# Patient Record
Sex: Female | Born: 1955 | Race: White | Hispanic: No | Marital: Married | Smoking: Never smoker
Health system: Southern US, Community
[De-identification: ages and names within clinical notes are randomized; demographics above are authoritative.]

## PROBLEM LIST (undated history)

## (undated) DIAGNOSIS — R569 Unspecified convulsions: Secondary | ICD-10-CM

## (undated) HISTORY — PX: TONSILLECTOMY: SHX5217

## (undated) HISTORY — DX: Unspecified convulsions: R56.9

## (undated) HISTORY — PX: COLONOSCOPY: SHX174

---

## 1984-09-08 HISTORY — PX: OOPHORECTOMY: SHX86

## 1984-09-08 HISTORY — PX: DIAGNOSTIC LAPAROSCOPY: SUR761

## 2005-09-08 DIAGNOSIS — R569 Unspecified convulsions: Secondary | ICD-10-CM

## 2005-09-08 HISTORY — DX: Unspecified convulsions: R56.9

## 2006-02-13 ENCOUNTER — Emergency Department (HOSPITAL_COMMUNITY): Admission: EM | Admit: 2006-02-13 | Discharge: 2006-02-13 | Payer: Self-pay | Admitting: Emergency Medicine

## 2008-06-15 ENCOUNTER — Other Ambulatory Visit: Admission: RE | Admit: 2008-06-15 | Discharge: 2008-06-15 | Payer: Self-pay | Admitting: Family Medicine

## 2008-10-16 ENCOUNTER — Other Ambulatory Visit: Admission: RE | Admit: 2008-10-16 | Discharge: 2008-10-16 | Payer: Self-pay | Admitting: Family Medicine

## 2012-12-10 ENCOUNTER — Telehealth: Payer: Self-pay | Admitting: *Deleted

## 2012-12-10 NOTE — Telephone Encounter (Signed)
See paper chart prior to EPIC.  Patient was scheduled for Urodynamic testing to assist in planning surgery.  After checking surgical benefits, patient states her OOP cost prevent her from considering surgery at this time.  Per Dr Edward Jolly, Urodynamics not indicated if not planning surgery so patient agrees to cancel appointment.  Would like to know what other options (besides surgery) she has and states she's "got to do something", please advise .Paper chart to office.

## 2012-12-12 NOTE — Telephone Encounter (Signed)
Schedule pt for ov to discuss her options

## 2012-12-13 NOTE — Telephone Encounter (Signed)
Regarding phone note (route to Ms. [name])

## 2012-12-14 NOTE — Telephone Encounter (Signed)
Left message on home CB# to return call to make OV appt. With Dr. Farrel Gobble. sue

## 2012-12-15 NOTE — Telephone Encounter (Signed)
Left message at CB# to return our call to set up appt. . sue

## 2012-12-16 ENCOUNTER — Ambulatory Visit: Payer: Self-pay | Admitting: Obstetrics and Gynecology

## 2012-12-20 ENCOUNTER — Institutional Professional Consult (permissible substitution): Payer: Self-pay | Admitting: Obstetrics and Gynecology

## 2012-12-20 ENCOUNTER — Telehealth: Payer: Self-pay | Admitting: Gynecology

## 2012-12-20 NOTE — Telephone Encounter (Signed)
Pt is calling to find out why the Urodynamics is no longer necessary. She wants to know how to proceed from here. She is unable to pay the amount necessary for the urodynamics visit. She is extremely disappointed with how we are handling her treatment. Please call the patient.   Dr. Edward Jolly,   I am not sure if you are able to discuss the different options with this patient or not. She has bladder prolapse and was supposed to also have an ovary removed.    Thanks,  [employer]

## 2012-12-20 NOTE — Telephone Encounter (Signed)
Tina Peck,  [name] is facilitating communication to set up an appointment for the patient.  Thank you.

## 2012-12-20 NOTE — Telephone Encounter (Signed)
I had asked that the patient come in to discuss her options, not sure what happened after that, I'd like to see her so we can figure out what works for her, I understand she has a high deductable but we need to come up with a plan, not sure if the correspondance got lost with our transistion to [employer]

## 2012-12-27 ENCOUNTER — Encounter: Payer: Self-pay | Admitting: *Deleted

## 2012-12-29 ENCOUNTER — Ambulatory Visit (INDEPENDENT_AMBULATORY_CARE_PROVIDER_SITE_OTHER): Payer: BC Managed Care – PPO | Admitting: Gynecology

## 2012-12-29 ENCOUNTER — Encounter: Payer: Self-pay | Admitting: Gynecology

## 2012-12-29 VITALS — BP 118/78 | HR 72 | Resp 16

## 2012-12-29 DIAGNOSIS — N8111 Cystocele, midline: Secondary | ICD-10-CM

## 2012-12-29 DIAGNOSIS — N814 Uterovaginal prolapse, unspecified: Secondary | ICD-10-CM

## 2012-12-29 DIAGNOSIS — R569 Unspecified convulsions: Secondary | ICD-10-CM | POA: Insufficient documentation

## 2012-12-29 DIAGNOSIS — IMO0002 Reserved for concepts with insufficient information to code with codable children: Secondary | ICD-10-CM

## 2012-12-29 NOTE — Progress Notes (Signed)
57 y.o. marriedWhite female G6P6 here to discuss alternatives to surgery for her uterine and bladder prolapse.  Pt in high deductable plan and at present cannot afford her deductable.  Pt reports prolapse is significant that she and her husband are unable to attain vaginal prolapse.   We had a long discussion regarding pessary placement which should buy her time and offer her comfort from her prolapse.  We discussed the different types of passaries available, questions were addressed and she is agreeable to try this route for now but ultimately would like to have surgical repair.  ROS: no discharge or pelvic pain, no dysuria, trouble voiding or hematuria. Denies incontinence, need for perineal splinting with bowel movements  Exam:   BP 118/78  Pulse 72  Resp 16 General appearance: alert, cooperative and appears stated age Cervical, supraclavicular, and axillary nodes normal. and jj   Pelvic: External genitalia:  no lesions              Urethra: normal appearing urethra with no masses, tenderness or lesions              Bartholins and Skenes: normal                 Vagina: PELVIC FLOOR EXAM: cystocele grade 3, uterine descensus grade to  hymen with minimal valsalva-supine              Cervix: normal appearance Bimanual Exam:  Uterus:  uterus is normal size, shape, consistency and nontender,  hypermobile               Adnexa:    no masses               Anus:  defer exam  The vaginal length was measured from apex to pubic symphysis, approx 8cm, a #5 ring with support was placed.  Pt denies any discomfort sitting or standing, ring shifted with valsalva in the supine position. Pt was able to remove and place with ease  A: Cystocele- symptomatic      Uterine prolapse  P: Pt will return to office to try alternative pessaries, she was instructed to be available to return the same day to confirm ease of voiding and good placement, she is agreeable   An After Visit Summary was printed and given to  the patient.

## 2013-01-06 ENCOUNTER — Telehealth: Payer: Self-pay | Admitting: *Deleted

## 2013-01-06 NOTE — Telephone Encounter (Signed)
Pessary has arrived, call to patient to schedule OV, LMTCB.

## 2013-01-10 ENCOUNTER — Ambulatory Visit (INDEPENDENT_AMBULATORY_CARE_PROVIDER_SITE_OTHER): Payer: BC Managed Care – PPO | Admitting: Nurse Practitioner

## 2013-01-10 ENCOUNTER — Encounter: Payer: Self-pay | Admitting: Nurse Practitioner

## 2013-01-10 ENCOUNTER — Other Ambulatory Visit: Payer: Self-pay | Admitting: Nurse Practitioner

## 2013-01-10 VITALS — BP 115/68 | HR 60 | Ht 69.75 in | Wt 158.0 lb

## 2013-01-10 DIAGNOSIS — G40309 Generalized idiopathic epilepsy and epileptic syndromes, not intractable, without status epilepticus: Secondary | ICD-10-CM

## 2013-01-10 MED ORDER — CARBAMAZEPINE 200 MG PO TABS: 200.0000 mg | ORAL_TABLET | Freq: Three times a day (TID) | ORAL | Status: DC

## 2013-01-10 NOTE — Patient Instructions (Addendum)
CBC, CMP, carbamazepine level today  Will renew medications Followup yearly

## 2013-01-10 NOTE — Progress Notes (Signed)
HPI: Patient returns for followup after last visit 09/22/2011. She is a previous patient of Dr. 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 She is taking and tolerating Epitol. She denies any dj vu, strange odors or taste. She has not had further seizure activity. She is tolerating her medication without side effects. EEG in the past was abnormal showing left frontotemporal epileptiform  activity.  ROS:   Negative Physical Exam General: well developed, well nourished, seated, in no evident distress Head: head normocephalic and atraumatic. Oropharynx benign Neck: supple with no carotid or supraclavicular bruits Cardiovascular: regular rate and rhythm, no murmurs  Neurologic Exam Mental Status: Awake and fully alert. Follows all commands.  Mood and affect appropriate.  Cranial Nerves: Fundoscopic exam reveals sharp disc margins. Pupils equal, briskly reactive to light. Extraocular movements full without nystagmus. Visual fields full to confrontation. Hearing intact and symmetric to finger snap. Facial sensation intact. Face, tongue, palate move normally and symmetrically. Neck flexion and extension normal.  Motor: Normal bulk and tone. Normal strength in all tested extremity muscles. Sensory.: intact to touch and pinprick and vibratory.  Coordination: Rapid alternating movements normal in all extremities. Finger-to-nose and heel-to-shin performed accurately bilaterally. Gait and Station: Arises from chair without difficulty. Stance is normal. Gait demonstrates normal stride length and balance . Able to heel, toe and tandem walk without difficulty.  Reflexes:2+ and symmetric. Toes downgoing.     ASSESSMENT: History of single nocturnal generalized seizure occurring in June 2007 EEG was abnormal showing left frontotemporal epileptiform activity.   PLAN:CBC, CMP, carbamazepine level today  Will renew medications Followup yearly. Patient will  be assigned to Dr. Kingsley Spittle Darrol Angel, GNP-BC APRN

## 2013-01-11 ENCOUNTER — Other Ambulatory Visit: Payer: Self-pay | Admitting: *Deleted

## 2013-01-11 DIAGNOSIS — N8110 Cystocele, unspecified: Secondary | ICD-10-CM

## 2013-01-11 LAB — COMPREHENSIVE METABOLIC PANEL
AST: 14 IU/L (ref 0–40)
Albumin/Globulin Ratio: 1.9 (ref 1.1–2.5)
Albumin: 4.2 g/dL (ref 3.5–5.5)
Alkaline Phosphatase: 133 IU/L — ABNORMAL HIGH (ref 39–117)
CO2: 30 mmol/L — ABNORMAL HIGH (ref 19–28)
GFR calc non Af Amer: 101 mL/min/{1.73_m2} (ref >59)
Globulin, Total: 2.2 g/dL (ref 1.5–4.5)
Potassium: 4.3 mmol/L (ref 3.5–5.2)
Sodium: 142 mmol/L (ref 134–144)
Total Protein: 6.4 g/dL (ref 6.0–8.5)

## 2013-01-11 LAB — CARBAMAZEPINE LEVEL, TOTAL: Carbamazepine Lvl: 5.6 ug/mL (ref 4.0–12.0)

## 2013-01-11 LAB — CBC WITH DIFFERENTIAL
Basophils Absolute: 0 10*3/uL (ref 0.0–0.2)
Basos: 1 % (ref 0–3)
Hemoglobin: 14.3 g/dL (ref 11.1–15.9)
MCHC: 34.9 g/dL (ref 31.5–35.7)
MCV: 88 fL (ref 79–97)
Monocytes: 8 % (ref 4–12)
Platelets: 323 10*3/uL (ref 155–379)

## 2013-01-13 NOTE — Telephone Encounter (Signed)
Spoke with patient to let her know that pessary has arrived and we can schedule appointment for insertion. Per patient request, appointment 01-24-13, declined earlier appointment.

## 2013-01-24 ENCOUNTER — Encounter: Payer: Self-pay | Admitting: Gynecology

## 2013-01-24 ENCOUNTER — Ambulatory Visit (INDEPENDENT_AMBULATORY_CARE_PROVIDER_SITE_OTHER): Payer: BC Managed Care – PPO | Admitting: Gynecology

## 2013-01-24 VITALS — BP 120/60

## 2013-01-24 DIAGNOSIS — N8111 Cystocele, midline: Secondary | ICD-10-CM

## 2013-01-24 DIAGNOSIS — N814 Uterovaginal prolapse, unspecified: Secondary | ICD-10-CM

## 2013-01-24 DIAGNOSIS — N8110 Cystocele, unspecified: Secondary | ICD-10-CM

## 2013-01-24 NOTE — Progress Notes (Signed)
57 y.o. married White female   G6P6 here for pessary fitting.  Patient has been diagnosed with the following indications warranting pessary use: Cystocele- symptomatic with uterine prolapse..    She reports these associated symptoms: Vaginal discharge: no Vaginal pressure:  yes Urinary symptoms:   none.   Other:  Pressure and bulge with voiding but no incontinence  She has been counseled about other options including pelvic physical therapy, surgical intervention, as well as doing nothing.  She has decided to proceed with pessary use and is here for fitting today.    Patient is sexually active. Urine culture has not been performed.  Exam: BP 120/60 General appearance: alert, cooperative and appears stated age Inguinal adenopathy: none   Pelvic: External genitalia:  no lesions              Urethra: normal appearing urethra with no masses, tenderness or lesions              Bartholins and Skenes: normal                 Vagina: vaginal tenderness none              Cervix: mobile, nontneder Bimanual Exam:  Uterus:  uterus is normal size, shape, consistency and nontender                               Adnexa:    not indicated                               Anus:  defer exam  Procedure:  Patient fitted with the following pessary and sizes:  Hodge with support #4.  After fitting, patient was advised to redress, ambulate, and attempt to void.  She is encouraged to do usually activities and to return to office in approx 3h to assess  Assessment:   Cystocele with uterine prolapse, for conservative management   Plan: Pt will return to office as assess afteer several hours, if good fit, we will teach pt to place and remove pessary.  She was advised to remove pessary with coitus   Aftercare summary was provided.    Pt returned back to office after wearing pessary for 3h, she states that she can feel it and wants it out.  Pt states that pessary came a little out with void but went back  in. We placed a ring with support #5 which was similarly comfortable in the office but concerns regarding the cystocele support we felt she should have a longer trail than she could this afternoon, pt agrees to return at a later date, she would like to try the hodge with support #3 We will call her when it is in

## 2013-09-21 ENCOUNTER — Telehealth: Payer: Self-pay | Admitting: *Deleted

## 2013-09-21 NOTE — Telephone Encounter (Signed)
Previously ordered a number 4 Hodge that patient decided she did not like.  I did not order another for her.  What do I need to order?

## 2013-09-21 NOTE — Telephone Encounter (Signed)
Message copied by Jaymes Graff on Wed Sep 21, 2013  1:11 PM ------      Message from: Suzy Bouchard      Created: Tue Sep 20, 2013  3:21 PM      Regarding: Pessary Order       Gay Filler,                  Tina Peck was here back in April and May. She said that another pessary was supposed to be ordered for her and she is waiting to hear from [place]. Is that what your notes show?  ------

## 2013-09-23 NOTE — Telephone Encounter (Signed)
5 ring with support in chartr

## 2013-09-23 NOTE — Telephone Encounter (Signed)
Call to patient. Advised that pessary was not ordere at last appointment. Apologized for delay. Patient states she is ok, she didn't follow-up either. Advised pessary ordered and may be here as soon as Monday.  Patient states she has AEX scheduled in March and will just wait until then because she does not want to pay another $30 co-pay. Advised it will be here if she decides she wants to come in for it or will hold till AEX 11-2013.  Routing to provider for final review. Patient agreeable to disposition. Will close encounter

## 2013-10-24 ENCOUNTER — Telehealth: Payer: Self-pay | Admitting: Nurse Practitioner

## 2013-10-24 NOTE — Telephone Encounter (Signed)
I called the pharmacy.  The pharmacist said she used to get this med through a discount program, but that drug is no longer part of that program and her co-pay would be $49.58.  Iwent online to goodrx.com and see the patient can get this drug at [facility] for $9.99 or [facility] for $10.17.  I called the patient back.  Relayed this info to her.  She will go online to goodrx.com and decide which pharmacy is best for her.  Whichever pharmacy she cooses can call [facility] and transfer the Rx via phone.  Patient was very grateful and will call us back if needed.

## 2013-10-24 NOTE — Telephone Encounter (Signed)
Pt calling very upset that her anti seizure medication has increased from $6 to $50 a month and she needs an alternative please call as soon as possible.

## 2013-11-14 ENCOUNTER — Ambulatory Visit: Payer: Self-pay | Admitting: Gynecology

## 2013-11-21 MED ORDER — ALUM-MAG HYDROXIDE-SIMETH 200 MG-200 MG-20 MG/5 ML ORAL SUSP
200-200-20 mg/5 mL | ORAL | Status: AC
Start: 2013-11-21 — End: 2013-11-21
  Administered 2013-11-21: 19:00:00 via ORAL

## 2013-11-21 MED ORDER — OMEPRAZOLE 20 MG TAB, DELAYED RELEASE
20 mg | ORAL_TABLET | Freq: Two times a day (BID) | ORAL | Status: AC
Start: 2013-11-21 — End: 2013-12-01

## 2013-11-21 MED ORDER — SUCRALFATE 100 MG/ML ORAL SUSP
100 mg/mL | Freq: Four times a day (QID) | ORAL | Status: AC
Start: 2013-11-21 — End: 2013-12-01

## 2013-11-21 MED FILL — MAG-AL PLUS 200 MG-200 MG-20 MG/5 ML ORAL SUSPENSION: 200-200-20 mg/5 mL | ORAL | Qty: 30

## 2013-11-21 NOTE — ED Notes (Signed)
Pt was able to drink gingerale   No vomiting, n o drooling  No diff breathing or speaking

## 2013-11-21 NOTE — ED Notes (Signed)
Pt sts she feels food is stuck   Was eating a club sandwich   Pt sts this has happened many times   sts didn't try to drink anything yet

## 2013-11-21 NOTE — ED Provider Notes (Signed)
HPI Comments: This pt was eating lunch a club sandwich and then felt a piece of sandwich get stuck in her throat/chest and was worried she could not breathe. Pt denies not removing the toothpick and denies any etoh and states she has had this in the past. Pt denies fevers or prior gi issues.        Patient is a 58 y.o. female presenting with abdominal pain. The history is provided by the patient, the spouse and a friend.   Abdominal Pain   This is a new problem. The pain is located in the epigastric region and chest. The quality of the pain is sharp. Associated symptoms include nausea. Pertinent negatives include no fever, no belching, no diarrhea, no vomiting, no dysuria, no frequency, no headaches, no myalgias, no chest pain and no back pain.        History reviewed. No pertinent past medical history.     Past Surgical History   Procedure Laterality Date   ??? Hx gi           History reviewed. No pertinent family history.     History     Social History   ??? Marital Status: MARRIED     Spouse Name: N/A     Number of Children: N/A   ??? Years of Education: N/A     Occupational History   ??? Not on file.     Social History Main Topics   ??? Smoking status: Never Smoker    ??? Smokeless tobacco: Not on file   ??? Alcohol Use: No   ??? Drug Use: No   ??? Sexual Activity: Not on file     Other Topics Concern   ??? Not on file     Social History Narrative   ??? No narrative on file                  ALLERGIES: Review of patient's allergies indicates no known allergies.      Review of Systems   Constitutional: Negative for fever and chills.   HENT: Negative for congestion and rhinorrhea.    Eyes: Negative for pain and visual disturbance.   Respiratory: Negative for cough and shortness of breath.    Cardiovascular: Negative for chest pain and palpitations.   Gastrointestinal: Positive for nausea and abdominal pain. Negative for vomiting, diarrhea and blood in stool.   Genitourinary: Negative for dysuria, frequency and difficulty urinating.    Musculoskeletal: Negative for myalgias and back pain.   Skin: Negative for color change and rash.   Neurological: Negative for seizures and headaches.   Psychiatric/Behavioral: Negative for self-injury and dysphoric mood.       Filed Vitals:    11/21/13 1402 11/21/13 1519   BP: 128/78 120/68   Pulse: 78 64   Temp: 98.2 ??F (36.8 ??C)    Resp: 14 16   Height: 5\' 9"  (1.753 m)    Weight: 70.308 kg (155 lb)    SpO2: 98% 99%            Physical Exam   Constitutional: She is oriented to person, place, and time. She appears well-developed and well-nourished. She appears distressed.   HENT:   Head: Normocephalic and atraumatic.   Eyes: Conjunctivae are normal. Pupils are equal, round, and reactive to light. No scleral icterus.   Neck: Normal range of motion. Neck supple. No tracheal deviation present.   Cardiovascular: Normal rate and regular rhythm.    No murmur heard.  Pulmonary/Chest:  Effort normal and breath sounds normal. No respiratory distress. She has no wheezes. She has no rales. She exhibits no tenderness.   Abdominal: Soft. Bowel sounds are normal. There is no tenderness.   Musculoskeletal: Normal range of motion. She exhibits no edema.   Neurological: She is alert and oriented to person, place, and time. She has normal reflexes. No cranial nerve deficit. She exhibits normal muscle tone.   Skin: Skin is warm and dry. No rash noted. She is not diaphoretic.   Psychiatric: She has a normal mood and affect. Her behavior is normal.   Nursing note and vitals reviewed.       MDM    Procedures    <EMERGENCY DEPARTMENT CASE SUMMARY>    Impression/Differential Diagnosis: possible esophageal fb, doubt tracheal    Plan:   Imaging and ginger ale and if not toletated then iv meds adn possible gi consult for egd    ED Course:   Pt easily tolerates po ginger ale with no regurgitation and then has fb sensation and is treate with maloox which also is well tolerated.    dw pt and spouse and friend with pt permission plan and  possible esophageal irritation as well as possible micro aspiration     Xr Chest [facility]    11/21/2013   EXAM:   XR Chest, 1 View.  CLINICAL HISTORY:   The patient is a 58 years  female; Fb sensation after eating  TECHNIQUE:   Frontal view of the chest.  COMPARISON:   No relevant prior studies available.  FINDINGS:    Lungs:  Unremarkable.  No consolidative infiltrate.    Pleural spaces:  Unremarkable.  No pneumothorax.    Heart:  Unremarkable.  No cardiomegaly.    Mediastinum:  The aorta is elongated.    Bones/joints:  There is mild scoliosis.    Soft tissues:  There is no evidence of radiopaque foreign body.     11/21/2013   IMPRESSION:         No acute findings.    THIS DOCUMENT HAS BEEN ELECTRONICALLY SIGNED BY Lidia Collum MD      Final Impression/Diagnosis:   1. Sensation of foreign body in esophagus      Patient condition at time of disposition: stable       I have reviewed the following home medications:    Prior to Admission medications    Medication Sig Start Date End Date Taking? Authorizing Provider   aspirin 81 mg chewable tablet Take 81 mg by mouth daily.   Yes Phys Other, MD   carBAMazepine (EPITOL) 200 mg tablet Take 200 mg by mouth three (3) times daily.   Yes Phys Other, MD   sucralfate (CARAFATE) 100 mg/mL suspension Take 10 mL by mouth four (4) times daily for 10 days. 11/21/13 12/01/13 Yes Montey Hora, MD   Omeprazole delayed release (PRILOSEC D/R) 20 mg tablet Take 1 Tab by mouth two (2) times a day for 20 doses. 11/21/13 12/01/13 Yes Montey Hora, MD         Montey Hora, MD

## 2013-11-21 NOTE — ED Notes (Signed)
Patient is awake, alert, and oriented, speech is clear and patient is able to ambulate  and ready for discharge. Verbal and written discharge instructions provided and has the cognitive understanding of discharge instructions. Discharged home in company of husband in no apparent distress. All questions answered.

## 2013-12-19 ENCOUNTER — Ambulatory Visit: Payer: Self-pay | Admitting: Gynecology

## 2014-01-02 ENCOUNTER — Ambulatory Visit (INDEPENDENT_AMBULATORY_CARE_PROVIDER_SITE_OTHER): Payer: BC Managed Care – PPO | Admitting: Gynecology

## 2014-01-02 ENCOUNTER — Encounter: Payer: Self-pay | Admitting: Gynecology

## 2014-01-02 VITALS — BP 130/74 | HR 64 | Ht 68.5 in | Wt 168.0 lb

## 2014-01-02 DIAGNOSIS — N814 Uterovaginal prolapse, unspecified: Secondary | ICD-10-CM

## 2014-01-02 DIAGNOSIS — N8111 Cystocele, midline: Secondary | ICD-10-CM

## 2014-01-02 DIAGNOSIS — Z01419 Encounter for gynecological examination (general) (routine) without abnormal findings: Secondary | ICD-10-CM

## 2014-01-02 DIAGNOSIS — N8110 Cystocele, unspecified: Secondary | ICD-10-CM

## 2014-01-02 NOTE — Progress Notes (Signed)
Patient ID: Tina Peck, female   DOB: June 04, 1956, 58 y.o.   MRN: [id number] 58 y.o. Tina Peck female G6P6 here for annual exam. Pt reports menses absent due to menopause.  She does not report hot flashes, does not have night sweats, does have vaginal dryness.  She is using lubricants.  She does not report post-menopasual bleeding.  Pt had tried a hodge without success, she might now want to consider surgery.  She states she can empty bladder with reorientation.  No LMP recorded. Patient is postmenopausal.          Sexually active: yes  The current method of family planning is post menopausal status.    Exercising: yes  Home exercise routine includes walking everyday.. Last pap:  11/08/12, WNL, neg HR HPV Abnormal PAP:  none Mammogram:  ?, over one year BSE:  monthly Colonoscopy:?, " Probably due" per pt  DEXA:  Up to date with neurology Alcohol:  none Tobacco:  None  Labs:  HB:  PCP  Urine:  Unable to void  Health Maintenance  Topic Date Due  . Mammogram  12/03/2003  . Zostavax  12/02/2013  . Influenza Vaccine  04/08/2014  . Pap Smear  11/09/2015  . Colonoscopy  09/08/2021  . Tetanus/tdap  11/09/2022    Family History  Problem Relation Age of Onset  . Cancer Maternal Grandfather     Patient Active Problem List   Diagnosis Date Noted  . 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 Prolapse of uterus 12/29/2012  . Cystocele 12/29/2012  . Seizure     Past Medical History  Diagnosis Date  . Seizure 2007    frontal lobe, none since    Past Surgical History  Procedure Laterality Date  . Tonsillectomy      as a child  . Oophorectomy Left 1986    LSO-Dermoid    Allergies: Review of patient's allergies indicates no known allergies.  Current Outpatient Prescriptions  Medication Sig Dispense Refill  . aspirin 81 MG tablet Take 81 mg by mouth daily.      . carbamazepine (EPITOL) 200 MG tablet Take 1 tablet (200 mg  total) by mouth 3 (three) times daily.  90 tablet  11   No current facility-administered medications for this visit.    ROS: Pertinent items are noted in HPI.  Exam:    BP 130/74  Pulse 64  Ht 5' 8.5" (1.74 m)  Wt 168 lb (76.204 kg)  BMI 25.17 kg/m2 Weight change: @WEIGHTCHANGE @ Last 3 height recordings:  Ht Readings from Last 3 Encounters:  01/02/14 5' 8.5" (1.74 m)  01/10/13 5' 9.75" (1.772 m)   General appearance: alert, cooperative and appears stated age Head: Normocephalic, without obvious abnormality, atraumatic Neck: no adenopathy, no carotid bruit, no JVD, supple, symmetrical, trachea midline and thyroid not enlarged, symmetric, no tenderness/mass/nodules Lungs: clear to auscultation bilaterally Breasts: normal appearance, no masses or tenderness Heart: regular rate and rhythm, S1, S2 normal, no murmur, click, rub or gallop Abdomen: soft, non-tender; bowel sounds normal; no masses,  no organomegaly Extremities: extremities normal, atraumatic, no cyanosis or edema Skin: Skin color, texture, turgor normal. No rashes or lesions Lymph nodes: Cervical, supraclavicular, and axillary nodes normal. no inguinal nodes palpated Neurologic: Grossly normal   Pelvic: External genitalia:  no lesions              Urethra: normal appearing urethra with no masses, tenderness or lesions  Bartholins and Skenes: normal                 Vagina: normal appearing vagina with normal color and discharge, no lesions, cystocele, rectocele              Cervix: normal appearance              Pap taken: no        Bimanual Exam:  Uterus:  uterus is normal size, shape, consistency and nontender, prolapse                                      Adnexa:    no masses                                      Rectovaginal: Confirms                                      Anus:  normal sphincter tone, no lesions  A: well woman Uterine prolapse     P: mammogram accommodating well to prolapse, not  interested in pessary at this time counseled on breast self exam, mammography screening, adequate intake of calcium and vitamin D, diet and exercise return annually or prn Discussed PAP guideline changes, importance of weight bearing exercises, calcium, vit D and balanced diet.  An After Visit Summary was printed and given to the patient.

## 2014-01-02 NOTE — Patient Instructions (Signed)

## 2014-01-31 ENCOUNTER — Telehealth: Payer: Self-pay

## 2014-01-31 ENCOUNTER — Telehealth: Payer: Self-pay | Admitting: Nurse Practitioner

## 2014-01-31 NOTE — Telephone Encounter (Signed)
Patient calling to state that she is having difficulty getting her Epitol medication again, states that she was previously getting it at a discount program price at [facility] but they can no longer do that for her and told her to get it from [facility]. Patient tried to get it there but they told her they don't honor the discount price. Please call and advise patient.

## 2014-01-31 NOTE — Telephone Encounter (Signed)
I called the patient back.  She has decided to get her Rx at [facility].  She will call back to schedule appt.

## 2014-01-31 NOTE — Telephone Encounter (Signed)
I called the patient back got noanswer, left message.

## 2014-01-31 NOTE — Telephone Encounter (Signed)
Patient called regarding Epitol Rx.  She has a Agricultural engineer and is able to get it at [facility] for $10 per month.  They told her they are not able to get the medication until the end of June due to a backorder issue with the manufacturer they use.  She said [facility] has the medication, but it will cost $50.  Patient says she "is not going to pay $50 for any medication and she will just stop taking it before she pays that amount".  I recommended she call other pharmacies and see if they have the medication at a lower cost.  She was very hesitant to do this, saying it's very frustrating.  Says she rather have med changed to something less expensive.  The patient was last seen in May of 2014, and will need to schedule annual appt.  Indicates she will call back to schedule an appt, but in the meantime would like something else prescribed.  Please advise.  Thank you.

## 2014-01-31 NOTE — Telephone Encounter (Signed)
Needs appt before medication change.

## 2014-03-06 ENCOUNTER — Telehealth: Payer: Self-pay | Admitting: *Deleted

## 2014-03-06 NOTE — Telephone Encounter (Signed)
--  AEX with Dr Charlies Constable done on 01-02-14 and patient declined pessary.  Pessary previously ordered for patient was never opened and given to her so may I add this back to stock instead of holding for her?

## 2014-03-07 NOTE — Telephone Encounter (Signed)
Yes, thank you.

## 2014-03-07 NOTE — Telephone Encounter (Signed)
Number 5 pessary ring with support returned to office stock.  If patient desires in future, will need to re-order.  Encounter closed.

## 2014-03-27 ENCOUNTER — Ambulatory Visit (INDEPENDENT_AMBULATORY_CARE_PROVIDER_SITE_OTHER): Payer: BC Managed Care – PPO | Admitting: Nurse Practitioner

## 2014-03-27 ENCOUNTER — Encounter: Payer: Self-pay | Admitting: Nurse Practitioner

## 2014-03-27 VITALS — BP 101/61 | HR 66 | Ht 69.0 in | Wt 171.8 lb

## 2014-03-27 DIAGNOSIS — R569 Unspecified convulsions: Secondary | ICD-10-CM

## 2014-03-27 DIAGNOSIS — G40309 Generalized idiopathic epilepsy and epileptic syndromes, not intractable, without status epilepticus: Secondary | ICD-10-CM

## 2014-03-27 DIAGNOSIS — Z5181 Encounter for therapeutic drug level monitoring: Secondary | ICD-10-CM

## 2014-03-27 MED ORDER — CARBAMAZEPINE 200 MG PO TABS: 200.0000 mg | ORAL_TABLET | Freq: Three times a day (TID) | ORAL | Status: DC

## 2014-03-27 NOTE — Patient Instructions (Signed)
Pt to continue Epitol at current dose. Will check labs today F/U in 1 year

## 2014-03-27 NOTE — Progress Notes (Signed)
[facility]  PATIENT: Tina Peck DOB: [birth year]   REASON FOR VISIT: follow up for seizure disorder   HISTORY OF PRESENT ILLNESS: 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 She is taking and tolerating Epitol. She denies any dj vu, strange odors or taste. She has not had further seizure activity. She is tolerating her medication without side effects. EEG in the past was abnormal showing left frontotemporal epileptiform activity. She returns for reevaluation   REVIEW OF SYSTEMS: Full 14 system review of systems performed and notable only for those listed, all others are neg:  Constitutional: N/A  Cardiovascular: N/A  Ear/Nose/Throat: N/A  Skin: N/A  Eyes: N/A  Respiratory: N/A  Gastroitestinal: N/A  Hematology/Lymphatic: N/A  Endocrine: N/A Musculoskeletal:N/A  Allergy/Immunology: N/A  Neurological: N/A Psychiatric: N/A Sleep : NA   ALLERGIES: No Known Allergies  HOME MEDICATIONS: Outpatient Prescriptions Prior to Visit  Medication Sig Dispense Refill  . aspirin 81 MG tablet Take 81 mg by mouth daily.      . carbamazepine (EPITOL) 200 MG tablet Take 1 tablet (200 mg total) by mouth 3 (three) times daily.  90 tablet  11   No facility-administered medications prior to visit.    PAST MEDICAL HISTORY: Past Medical History  Diagnosis Date  . Seizure 2007    frontal lobe, none since    PAST SURGICAL HISTORY: Past Surgical History  Procedure Laterality Date  . Tonsillectomy      as a child  . Oophorectomy Left 1986    LSO-Dermoid    FAMILY HISTORY: Family History  Problem Relation Age of Onset  . Cancer Maternal Grandfather     SOCIAL HISTORY: History   Social History  . Marital Status: Single    Spouse Name: N/A    Number of Children: 6  . Years of Education: 12th   Occupational History  . homemaker    Social  History Main Topics  . Smoking status: Never Smoker   . Smokeless tobacco: Never Used  . Alcohol Use: No     Comment: drinks coffee and tea daily  . Drug Use: No  . Sexual Activity: Yes    Partners: Male    Birth Control/ Protection: Post-menopausal   Other Topics Concern  . Not on file   Social History Narrative   Patient lives at home with her husband and has 6 children. 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      PHYSICAL EXAM  Filed Vitals:   03/27/14 1453  BP: 101/61  Pulse: 66  Height: 5\' 9"  (1.753 m)  Weight: 171 lb 12.8 oz (77.928 kg)   Body mass index is 25.36 kg/(m^2). General: well developed, well nourished, seated, in no evident distress  Head: head normocephalic and atraumatic. Oropharynx benign  Neck: supple with no carotid or supraclavicular bruits  Cardiovascular: regular rate and rhythm, no murmurs  Neurologic Exam  Mental Status: Awake and fully alert. Follows all commands. Mood and affect appropriate.  Cranial Nerves: Pupils equal, briskly reactive to light. Extraocular movements full without nystagmus. Visual fields full to confrontation. Hearing intact and symmetric to finger snap. Facial sensation intact. Face, tongue, palate move normally and symmetrically. Neck flexion and extension normal.  Motor: Normal bulk and tone. Normal strength in all tested extremity muscles.  Sensory.: intact to touch and pinprick and vibratory.  Coordination: Rapid alternating movements normal in all extremities. Finger-to-nose and heel-to-shin  performed accurately bilaterally.  Gait and Station: Arises from chair without difficulty. Stance is normal. Gait demonstrates normal stride length and balance . Able to heel, toe and tandem walk without difficulty.  Reflexes:2+ and symmetric. Toes downgoing.   DIAGNOSTIC DATA (LABS, IMAGING, TESTING) - I reviewed patient records, labs, notes, testing and imaging myself where available.  Lab Results  Component  Value Date   WBC 4.8 01/10/2013   HGB 14.3 01/10/2013   HCT 41.0 01/10/2013   MCV 88 01/10/2013   PLT 323 01/10/2013      Component Value Date/Time   NA 142 01/10/2013 0912   K 4.3 01/10/2013 0912   CL 104 01/10/2013 0912   CO2 30* 01/10/2013 0912   GLUCOSE 74 01/10/2013 0912   BUN 10 01/10/2013 0912   CREATININE 0.58 01/10/2013 0912   CALCIUM 9.2 01/10/2013 0912   PROT 6.4 01/10/2013 0912   AST 14 01/10/2013 0912   ALT 13 01/10/2013 0912   ALKPHOS 133* 01/10/2013 0912   BILITOT 0.2 01/10/2013 0912   GFRNONAA 101 01/10/2013 0912   GFRAA 117 01/10/2013 0912    ASSESSMENT AND PLAN  58 y.o. year old female  has a past medical history of Seizure (2007). here to followup. Last seizure occurred in 2007. EEG in the past have been abnormal.  Pt to continue Epitol at current dose. Will check labs today F/U in 1 year Dennie Bible, [facility], [facility], APRN  [facility] [address] [place], [place] [address] [phone]

## 2014-03-28 LAB — CBC WITH DIFFERENTIAL/PLATELET
BASOS: 1 %
Basophils Absolute: 0.1 10*3/uL (ref 0.0–0.2)
EOS ABS: 0.2 10*3/uL (ref 0.0–0.4)
Eos: 4 %
HEMATOCRIT: 40.1 % (ref 34.0–46.6)
Hemoglobin: 13.7 g/dL (ref 11.1–15.9)
IMMATURE GRANULOCYTES: 0 %
Immature Grans (Abs): 0 10*3/uL (ref 0.0–0.1)
LYMPHS ABS: 2.4 10*3/uL (ref 0.7–3.1)
LYMPHS: 36 %
MCH: 29.7 pg (ref 26.6–33.0)
MCHC: 34.2 g/dL (ref 31.5–35.7)
MCV: 87 fL (ref 79–97)
MONOCYTES: 9 %
Monocytes Absolute: 0.6 10*3/uL (ref 0.1–0.9)
NEUTROS ABS: 3.4 10*3/uL (ref 1.4–7.0)
NEUTROS PCT: 50 %
RBC: 4.62 x10E6/uL (ref 3.77–5.28)
RDW: 14.1 % (ref 12.3–15.4)
WBC: 6.6 10*3/uL (ref 3.4–10.8)

## 2014-03-28 LAB — COMPREHENSIVE METABOLIC PANEL
A/G RATIO: 2 (ref 1.1–2.5)
ALBUMIN: 4.2 g/dL (ref 3.6–4.8)
ALT: 14 IU/L (ref 0–32)
AST: 12 IU/L (ref 0–40)
Alkaline Phosphatase: 118 IU/L — ABNORMAL HIGH (ref 39–117)
BUN/Creatinine Ratio: 16 (ref 11–26)
BUN: 9 mg/dL (ref 8–27)
CO2: 23 mmol/L (ref 18–29)
Calcium: 8.9 mg/dL (ref 8.7–10.3)
Chloride: 101 mmol/L (ref 97–108)
Creatinine, Ser: 0.55 mg/dL — ABNORMAL LOW (ref 0.57–1.00)
GFR calc Af Amer: 118 mL/min/{1.73_m2} (ref >59)
GFR, EST NON AFRICAN AMERICAN: 102 mL/min/{1.73_m2} (ref >59)
GLOBULIN, TOTAL: 2.1 g/dL (ref 1.5–4.5)
GLUCOSE: 101 mg/dL — AB (ref 65–99)
Potassium: 4.2 mmol/L (ref 3.5–5.2)
Sodium: 140 mmol/L (ref 134–144)
TOTAL PROTEIN: 6.3 g/dL (ref 6.0–8.5)
Total Bilirubin: <0.2 mg/dL (ref 0.0–1.2)

## 2014-03-28 LAB — CARBAMAZEPINE LEVEL, TOTAL: CARBAMAZEPINE LVL: 5.3 ug/mL (ref 4.0–12.0)

## 2014-03-29 NOTE — Progress Notes (Signed)
Quick Note:  Shared lab results with patient thru VM message ______

## 2014-04-04 ENCOUNTER — Other Ambulatory Visit: Payer: Self-pay | Admitting: Gynecology

## 2014-04-04 DIAGNOSIS — Z1231 Encounter for screening mammogram for malignant neoplasm of breast: Secondary | ICD-10-CM

## 2014-04-19 ENCOUNTER — Telehealth: Payer: Self-pay | Admitting: Nurse Practitioner

## 2014-04-19 NOTE — Telephone Encounter (Signed)
I called pt back and relayed the lab results from 03-27-14 looked good per Cecille Rubin NP.  She verbalized understanding.

## 2014-04-19 NOTE — Telephone Encounter (Signed)
Patient returning sandy's call regarding results, please call and advise, patient wants to be called on her cell phone number always and not home phone.

## 2014-04-24 ENCOUNTER — Telehealth: Payer: Self-pay | Admitting: *Deleted

## 2014-04-24 NOTE — Telephone Encounter (Signed)
I called the patient back.  She is trying to get her Rx from [facility], since they only charge $10, whereas [facility] is $20.  I gave her the website goodrx.com to get discount voucher and as well provided number for Rx Outreach, [phone].  She will follow up with these and call us back if needed.

## 2014-04-25 ENCOUNTER — Telehealth: Payer: Self-pay | Admitting: Nurse Practitioner

## 2014-04-25 MED ORDER — CARBAMAZEPINE 200 MG PO TABS: 200.0000 mg | ORAL_TABLET | Freq: Three times a day (TID) | ORAL | Status: DC

## 2014-04-25 NOTE — Telephone Encounter (Signed)
Rx has been sent along with discount voucher (noted on Rx: discount card: BIN: [id number] PCN: [id number] ID: [id number] GROUP: [id number]).  I called the patient.  She is aware.

## 2014-04-25 NOTE — Telephone Encounter (Signed)
Patient stated [facility] has medication for carbamazepine (EPITOL) 200 MG tablet.  Requesting Rx sent to [facility] on [address] and to fax voucher as well.  Please return call anytime, leave message if not available.  Thanks

## 2014-05-01 ENCOUNTER — Ambulatory Visit (HOSPITAL_COMMUNITY)
Admission: RE | Admit: 2014-05-01 | Discharge: 2014-05-01 | Disposition: A | Discharge status: Home / Self Care | Payer: BC Managed Care – PPO | Source: Ambulatory Visit | Attending: Gynecology | Admitting: Gynecology

## 2014-05-01 DIAGNOSIS — Z1231 Encounter for screening mammogram for malignant neoplasm of breast: Secondary | ICD-10-CM

## 2014-07-10 ENCOUNTER — Encounter: Payer: Self-pay | Admitting: Nurse Practitioner

## 2014-07-26 ENCOUNTER — Encounter: Payer: Self-pay | Admitting: Neurology

## 2014-07-28 ENCOUNTER — Telehealth: Payer: Self-pay | Admitting: Nurse Practitioner

## 2014-07-28 NOTE — Telephone Encounter (Signed)
Patient requesting referral from our office.  Patient experiencing medication being lodged in throat, had a very bad episode last evening and would like to get this taken care of asap.  Please call and advise.

## 2014-07-31 NOTE — Telephone Encounter (Signed)
I called and left a detailed message and I also forwarded message to Dr. Maceo Pro to do Referral.

## 2014-07-31 NOTE — Telephone Encounter (Signed)
Getting medication lodged would not be from her seizure disorder. She may want to follow up with PCP and be evaluated.

## 2014-08-01 ENCOUNTER — Encounter: Payer: Self-pay | Admitting: Neurology

## 2014-08-02 ENCOUNTER — Encounter: Payer: Self-pay | Admitting: Neurology

## 2015-01-08 ENCOUNTER — Ambulatory Visit: Payer: BC Managed Care – PPO | Admitting: Nurse Practitioner

## 2015-01-08 ENCOUNTER — Ambulatory Visit: Payer: BC Managed Care – PPO | Admitting: Gynecology

## 2015-03-19 ENCOUNTER — Encounter: Payer: Self-pay | Admitting: Nurse Practitioner

## 2015-03-19 ENCOUNTER — Ambulatory Visit (INDEPENDENT_AMBULATORY_CARE_PROVIDER_SITE_OTHER): Payer: 59 | Admitting: Nurse Practitioner

## 2015-03-19 VITALS — BP 134/76 | HR 64 | Ht 69.0 in | Wt 171.0 lb

## 2015-03-19 DIAGNOSIS — Z01419 Encounter for gynecological examination (general) (routine) without abnormal findings: Secondary | ICD-10-CM | POA: Diagnosis not present

## 2015-03-19 DIAGNOSIS — Z1211 Encounter for screening for malignant neoplasm of colon: Secondary | ICD-10-CM

## 2015-03-19 DIAGNOSIS — Z0000 Encounter for general adult medical examination without abnormal findings: Secondary | ICD-10-CM | POA: Diagnosis not present

## 2015-03-19 MED ORDER — ESTROGENS, CONJUGATED 0.625 MG/GM VA CREA: 1.0000 | TOPICAL_CREAM | Freq: Every day | VAGINAL | Status: DC

## 2015-03-19 NOTE — Patient Instructions (Signed)

## 2015-03-19 NOTE — Progress Notes (Signed)
Patient ID: Tina Peck, female   DOB: 28-Feb-1956, 59 y.o.   MRN: [id number] 59 y.o. G6P6 Married Caucasian Fe here for annual exam.   Menopausal at age 8 without need for HRT.  At last few visit with Dr. Charlies Constable she had been having significant uterine and bladder prolapse issues.  She was formulating a plan for surgical intervention. Pt. was concerned about down time and cost and just never followed up.  Now she is having more problems with completely emptying of bladder and feeling as though everything is falling out more.  She seems to be wanting more help with bladder prolapse than uterine.  She uses lubrication for SA.   Patient's last menstrual period was 09/08/2006.          Sexually active: Yes.    The current method of family planning is post menopausal status.    Exercising: Yes.    walking at least twice per week Smoker:  no  Health Maintenance: Pap:  11/08/12, negative with neg HR HPV MMG:  05/01/14, Bi-Rads 1:  Negative Colonoscopy:  Over 10 years.  BMD:   Current with neurology due to anti-seizure medication TDaP:  UTD Labs: Neurology   reports that she has never smoked. She has never used smokeless tobacco. She reports that she does not drink alcohol or use illicit drugs.  Past Medical History  Diagnosis Date  . Seizure 2007    frontal lobe, none since    Past Surgical History  Procedure Laterality Date  . Tonsillectomy      as a child  . Oophorectomy Left 1986    LSO-Dermoid    Current Outpatient Prescriptions  Medication Sig Dispense Refill  . aspirin 81 MG tablet Take 81 mg by mouth daily.    . EPITOL 200 MG tablet Take 1 tablet by mouth 3 (three) times daily.    Marland Kitchen conjugated estrogens (PREMARIN) vaginal cream Place 1 Applicatorful vaginally daily. Use 1/2 g vaginally every night at bed time for the first 2 weeks, then use 1/2 g vaginally two or three times per week as needed to maintain symptom relief. 60 g 3   No current facility-administered medications  for this visit.    Family History  Problem Relation Age of Onset  . Cancer Maternal Grandfather   . Osteoarthritis Mother   . Spina bifida Brother 0    lived 4 months  . Glaucoma Father 35  . Diabetes Paternal Grandmother     ROS:  Pertinent items are noted in HPI.  Otherwise, a comprehensive ROS was negative.  Exam:   BP 134/76 mmHg  Pulse 64  Ht 5\' 9"  (1.753 m)  Wt 171 lb (77.565 kg)  BMI 25.24 kg/m2  LMP 09/08/2006 Height: 5\' 9"  (175.3 cm) Ht Readings from Last 3 Encounters:  03/19/15 5\' 9"  (1.753 m)  03/27/14 5\' 9"  (1.753 m)  01/02/14 5' 8.5" (1.74 m)    General appearance: alert, cooperative and appears stated age Head: Normocephalic, without obvious abnormality, atraumatic Neck: no adenopathy, supple, symmetrical, trachea midline and thyroid normal to inspection and palpation Lungs: clear to auscultation bilaterally Breasts: normal appearance, no masses or tenderness Heart: regular rate and rhythm Abdomen: soft, non-tender; no masses,  no organomegaly Extremities: extremities normal, atraumatic, no cyanosis or edema Skin: Skin color, texture, turgor normal. No rashes or lesions. ? Area of psoriasis left mid to lower abdomen. Skin change with mole on left posterior arm. Lymph nodes: Cervical, supraclavicular, and axillary nodes normal. No abnormal inguinal nodes  palpated Neurologic: Grossly normal   Pelvic: External genitalia:  no lesions              Urethra:  normal appearing urethra with no masses, tenderness or lesions              Bartholin's and Skene's: normal                 Vagina: normal appearing vagina with normal color and discharge, no lesions              Cervix: anteverted              Pap taken: No. Bimanual Exam:  Uterus:  normal size, contour, position, consistency, mobility, non-tender, hard and prolapsed fourth degree  With pt. Standing and valsalva, the prolapse is slight beyond the introitus.              Adnexa: no mass, fullness,  tenderness               Rectovaginal: Confirms               Anus:  normal sphincter tone, no lesions  Chaperone present:  yes  A:  Well Woman with normal exam  Significant Uterine, pelvic floor, and bladder prolapse  S/P LSO secondary to dermoid cyst  Atrophic vaginitis  Skin changes - ? Psoriasis  History of seizure   P:   Reviewed health and wellness pertinent to exam  Pap smear as above  Mammogram is due 8/16  Will have pt to return and have exam and consult with Dr. Quincy Simmonds  Will start her on Premarin vaginal cream for the atrophy and for the dryness of the uterine tissue from prolapse. She is given written RX so that she may try to find the most economical way to get her med's.  Counseled with risk of CVA, DVT, cancer, etc  She will make dermatology apt.  Scheduled a consult visit with Dr. Juliette Alcide on breast self exam, mammography screening, use and side effects of HRT, adequate intake of calcium and vitamin D, diet and exercise, Kegel's exercises return annually or prn  An After Visit Summary was printed and given to the patient.

## 2015-03-22 NOTE — Progress Notes (Signed)
Encounter reviewed by Dr. Brook Amundson C. Silva.  

## 2015-03-28 ENCOUNTER — Ambulatory Visit: Payer: BC Managed Care – PPO | Admitting: Nurse Practitioner

## 2015-03-30 ENCOUNTER — Telehealth: Payer: Self-pay | Admitting: Nurse Practitioner

## 2015-03-30 NOTE — Telephone Encounter (Signed)
Lisa from Dr. Lorie Apley office is calling to update our office about the status of the referral to their office. Per Lattie Haw, the patient's insurance requires a referral from her PCP. I gave Lattie Haw the PCP we have on file: Briscoe Deutscher, MD. She will notify his office.

## 2015-04-04 ENCOUNTER — Ambulatory Visit: Payer: 59 | Admitting: Obstetrics and Gynecology

## 2015-04-09 ENCOUNTER — Ambulatory Visit (INDEPENDENT_AMBULATORY_CARE_PROVIDER_SITE_OTHER): Payer: 59 | Admitting: Nurse Practitioner

## 2015-04-09 ENCOUNTER — Encounter: Payer: Self-pay | Admitting: Nurse Practitioner

## 2015-04-09 VITALS — BP 106/66 | HR 64 | Ht 69.0 in | Wt 170.4 lb

## 2015-04-09 DIAGNOSIS — Z5181 Encounter for therapeutic drug level monitoring: Secondary | ICD-10-CM

## 2015-04-09 DIAGNOSIS — G40309 Generalized idiopathic epilepsy and epileptic syndromes, not intractable, without status epilepticus: Secondary | ICD-10-CM | POA: Diagnosis not present

## 2015-04-09 NOTE — Progress Notes (Signed)
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 She is taking and tolerating Epitol. She denies any dj vu, strange odors or taste. She has not had further seizure activity. She is tolerating her medication without side effects. EEG in the past was abnormal showing left frontotemporal epileptiform activity. She returns for reevaluation.    REVIEW OF SYSTEMS: Full 14 system review of systems performed and notable only for those listed, all others are neg:  Constitutional: neg  Cardiovascular: neg Ear/Nose/Throat: neg  Skin: neg Eyes: neg Respiratory: neg Gastroitestinal: neg  Hematology/Lymphatic: neg  Endocrine: neg Musculoskeletal:neg Allergy/Immunology: neg Neurological: neg Psychiatric: neg Sleep : neg   ALLERGIES: No Known Allergies  HOME MEDICATIONS: Outpatient Prescriptions Prior to Visit  Medication Sig Dispense Refill  . aspirin 81 MG tablet Take 81 mg by mouth daily.    Marland Kitchen conjugated estrogens (PREMARIN) vaginal cream Place 1 Applicatorful vaginally daily. Use 1/2 g vaginally every night at bed time for the first 2 weeks, then use 1/2 g vaginally two or three times per week as needed to maintain symptom relief. 60 g 3  . EPITOL 200 MG tablet Take 1 tablet by mouth 3 (three) times daily.     No facility-administered medications prior to visit.    PAST MEDICAL HISTORY: Past Medical History  Diagnosis Date  . Seizure 2007    frontal lobe, none since    PAST SURGICAL HISTORY: Past Surgical History  Procedure Laterality Date  . Tonsillectomy      as a child  . Oophorectomy Left 1986    LSO-Dermoid    FAMILY HISTORY: Family History  Problem Relation Age of Onset  .  Cancer Maternal Grandfather   . Osteoarthritis Mother   . Spina bifida Brother 0    lived 4 months  . Glaucoma Father 74  . Diabetes Paternal Grandmother     SOCIAL HISTORY: History   Social History  . Marital Status: Married    Spouse Name: N/A  . Number of Children: 6  . Years of Education: 12th   Occupational History  . homemaker    Social History Main Topics  . Smoking status: Never Smoker   . Smokeless tobacco: Never Used  . Alcohol Use: No     Comment: drinks coffee and tea daily  . Drug Use: No  . Sexual Activity:    Partners: Male    Birth Control/ Protection: Post-menopausal   Other Topics Concern  . Not on file   Social History Narrative   Patient lives at home with her husband and has 6 children. 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      PHYSICAL EXAM  Filed Vitals:   04/09/15 0725  BP: 106/66  Pulse: 64  Height: 5\' 9"  (1.753 m)  Weight: 170 lb 6.4 oz (77.293 kg)   Body mass index is 25.15 kg/(m^2). General: well developed, well nourished, seated, in no evident distress  Head: head normocephalic and atraumatic. Oropharynx benign  Neck: supple with no carotid or supraclavicular bruits  Neurologic Exam  Mental Status: Awake and fully alert. Follows all commands. Mood and affect appropriate.  Cranial Nerves: Pupils equal, briskly reactive to light. Extraocular movements full without nystagmus. Visual fields full to confrontation. Hearing intact  and symmetric to finger snap. Facial sensation intact. Face, tongue, palate move normally and symmetrically. Neck flexion and extension normal.  Motor: Normal bulk and tone. Normal strength in all tested extremity muscles.  Coordination: Rapid alternating movements normal in all extremities. Finger-to-nose and heel-to-shin performed accurately bilaterally.  Gait and Station: Arises from chair without difficulty. Stance is normal. Gait demonstrates normal stride length and balance . Able  to heel, toe and tandem walk without difficulty.  Reflexes:2+ and symmetric. Toes downgoing.    DIAGNOSTIC DATA (LABS, IMAGING, TESTING) -  ASSESSMENT AND PLAN  59 y.o. year old female  has a past medical history of Seizure (2007). Here  to follow up. Her seizure activity is currently well-controlled on Epitol. Previous DEXA scan was normal. Patient will check with insurance prior to getting repeat DEXA scan.  Continue Epitol at current dose will refill for 1 year Labs today ,CBC, CMP and CBZ F/U yearly and prn Call for seizure activity Dennie Bible, [facility], [facility], [facility] [address] [place], [place] [address] [phone]

## 2015-04-09 NOTE — Patient Instructions (Signed)
Continue Epitol at current dose Labs today F/U yearly and prn Call for seizure activity

## 2015-04-10 ENCOUNTER — Telehealth: Payer: Self-pay | Admitting: *Deleted

## 2015-04-10 LAB — COMPREHENSIVE METABOLIC PANEL
A/G RATIO: 1.8 (ref 1.1–2.5)
ALK PHOS: 122 IU/L — AB (ref 39–117)
ALT: 21 IU/L (ref 0–32)
AST: 14 IU/L (ref 0–40)
Albumin: 4.1 g/dL (ref 3.6–4.8)
BUN / CREAT RATIO: 15 (ref 11–26)
BUN: 8 mg/dL (ref 8–27)
Bilirubin Total: <0.2 mg/dL (ref 0.0–1.2)
CHLORIDE: 100 mmol/L (ref 97–108)
CO2: 24 mmol/L (ref 18–29)
CREATININE: 0.55 mg/dL — AB (ref 0.57–1.00)
Calcium: 9.1 mg/dL (ref 8.7–10.3)
GFR, EST AFRICAN AMERICAN: 117 mL/min/{1.73_m2} (ref >59)
GFR, EST NON AFRICAN AMERICAN: 102 mL/min/{1.73_m2} (ref >59)
Globulin, Total: 2.3 g/dL (ref 1.5–4.5)
Glucose: 101 mg/dL — ABNORMAL HIGH (ref 65–99)
POTASSIUM: 4.5 mmol/L (ref 3.5–5.2)
Sodium: 141 mmol/L (ref 134–144)
TOTAL PROTEIN: 6.4 g/dL (ref 6.0–8.5)

## 2015-04-10 LAB — CBC WITH DIFFERENTIAL/PLATELET
BASOS ABS: 0 10*3/uL (ref 0.0–0.2)
Basos: 1 %
EOS (ABSOLUTE): 0.1 10*3/uL (ref 0.0–0.4)
EOS: 3 %
Hematocrit: 42.3 % (ref 34.0–46.6)
Hemoglobin: 13.8 g/dL (ref 11.1–15.9)
IMMATURE GRANS (ABS): 0 10*3/uL (ref 0.0–0.1)
IMMATURE GRANULOCYTES: 0 %
LYMPHS ABS: 1.6 10*3/uL (ref 0.7–3.1)
LYMPHS: 34 %
MCH: 29.4 pg (ref 26.6–33.0)
MCHC: 32.6 g/dL (ref 31.5–35.7)
MCV: 90 fL (ref 79–97)
MONOCYTES: 8 %
MONOS ABS: 0.4 10*3/uL (ref 0.1–0.9)
NEUTROS ABS: 2.5 10*3/uL (ref 1.4–7.0)
NEUTROS PCT: 54 %
Platelets: 297 10*3/uL (ref 150–379)
RBC: 4.7 x10E6/uL (ref 3.77–5.28)
RDW: 13.9 % (ref 12.3–15.4)
WBC: 4.6 10*3/uL (ref 3.4–10.8)

## 2015-04-10 LAB — CARBAMAZEPINE LEVEL, TOTAL: CARBAMAZEPINE LVL: 4.7 ug/mL (ref 4.0–12.0)

## 2015-04-10 NOTE — Telephone Encounter (Signed)
-----   Message from Dennie Bible, NP sent at 04/10/2015  7:55 AM EDT ----- Labs look good please call the patient

## 2015-04-10 NOTE — Telephone Encounter (Signed)
I spoke with pt and relayed that the lab results looked good.  She verbalized understanding.

## 2015-04-13 NOTE — Progress Notes (Signed)
I have reviewed and agreed above plan. 

## 2015-04-25 ENCOUNTER — Ambulatory Visit: Payer: 59 | Admitting: Obstetrics and Gynecology

## 2015-05-16 ENCOUNTER — Telehealth: Payer: Self-pay | Admitting: Obstetrics and Gynecology

## 2015-05-16 NOTE — Telephone Encounter (Signed)
lm for patient to call back to confirm appt was moved to 3:00 from 2:30/[place]

## 2015-05-17 NOTE — Telephone Encounter (Signed)
Confirmed.

## 2015-05-23 ENCOUNTER — Encounter: Payer: Self-pay | Admitting: Obstetrics and Gynecology

## 2015-05-23 ENCOUNTER — Ambulatory Visit: Payer: 59 | Admitting: Obstetrics and Gynecology

## 2015-05-23 ENCOUNTER — Ambulatory Visit (INDEPENDENT_AMBULATORY_CARE_PROVIDER_SITE_OTHER): Payer: 59 | Admitting: Obstetrics and Gynecology

## 2015-05-23 VITALS — BP 110/82 | HR 70 | Wt 170.2 lb

## 2015-05-23 DIAGNOSIS — N812 Incomplete uterovaginal prolapse: Secondary | ICD-10-CM

## 2015-05-23 NOTE — Progress Notes (Signed)
Patient ID: Tina Peck, female   DOB: 07/29/1956, 59 y.o.   MRN: [id number] GYNECOLOGY  VISIT   HPI: 59 y.o.   Married  Caucasian  female   G6P6 with Patient's last menstrual period was 09/08/2006.   here for uterine/bladder prolpase.  Patient complains of urinary frequency and states never feels like she quite empties completely    Tried a few pessaries and the prolapse is getting worse over the last 2 years.   Difficulty emptying bladder.  No leakage of urine ever.  Had urgency and frequency.  Can barley make it to bathroom at time.   Difficulty having bowel movements.  No splinting.  Has done an enema to relieve her bowel movement.   Would like to be more sexually active but prolapse is a bit in the way.   Did not fill the Premarin due to cost.  Not really having pain due to prolapse.  From [place].  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   GYNECOLOGIC HISTORY: Patient's last menstrual period was 09/08/2006. Contraception: Postmenopausal Menopausal hormone therapy: Premarin vaginal cream--but hasn't taken this yet. Last mammogram: 05-01-14 Density Cat.B/Neg:[facility]. Last pap smear: 11-08-12 Neg:Neg HR HPV        OB History    Gravida Para Term Preterm AB TAB SAB Ectopic Multiple Living   6 6                 Patient Active Problem List   Diagnosis Date Noted  . 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 Prolapse of uterus 12/29/2012  . Cystocele 12/29/2012  . Seizure     Past Medical History  Diagnosis Date  . Seizure 2007    frontal lobe, none since    Past Surgical History  Procedure Laterality Date  . Tonsillectomy      as a child  . Oophorectomy Left 1986    LSO-Dermoid    Current Outpatient Prescriptions  Medication Sig Dispense Refill  . aspirin 81 MG tablet Take 81 mg by mouth daily.    . cholecalciferol (VITAMIN D) 1000 UNITS tablet Take 5,000 Units by mouth 2 (two) times daily.    . cyanocobalamin 1000  MCG tablet Take 100 mcg by mouth.    . EPITOL 200 MG tablet Take 1 tablet by mouth 3 (three) times daily.    Marland Kitchen conjugated estrogens (PREMARIN) vaginal cream Place 1 Applicatorful vaginally daily. Use 1/2 g vaginally every night at bed time for the first 2 weeks, then use 1/2 g vaginally two or three times per week as needed to maintain symptom relief. (Patient not taking: Reported on 05/23/2015) 60 g 3   No current facility-administered medications for this visit.     ALLERGIES: Review of patient's allergies indicates no known allergies.  Family History  Problem Relation Age of Onset  . Cancer Maternal Grandfather   . Osteoarthritis Mother   . Spina bifida Brother 0    lived 4 months  . Glaucoma Father 48  . Diabetes Paternal Grandmother     Social History   Social History  . Marital Status: Married    Spouse Name: N/A  . Number of Children: 6  . Years of Education: 12th   Occupational History  . homemaker    Social History Main Topics  . Smoking status: Never Smoker   . Smokeless tobacco: Never Used  . Alcohol Use: No     Comment: drinks coffee and tea daily  . Drug Use: No  .  Sexual Activity:    Partners: Male    Birth Control/ Protection: Post-menopausal   Other Topics Concern  . Not on file   Social History Narrative   Patient lives at home with her husband and has 6 children. 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     ROS:  Pertinent items are noted in HPI.  PHYSICAL EXAMINATION:    BP 110/82 mmHg  Pulse 70  Wt 170 lb 3.2 oz (77.202 kg)  LMP 09/08/2006    General appearance: alert, cooperative and appears stated age   Abdomen: Pfannenstiel incision, soft, non-tender; bowel sounds normal; no masses,  no organomegaly  Pelvic: External genitalia:  no lesions              Urethra:  normal appearing urethra with no masses, tenderness or lesions              Bartholins and Skenes: normal                 Vagina: normal appearing vagina with  normal color and discharge, no lesions.  Third degree cystocele, second degree uterine prolapse, first degree rectocele.               Cervix: no lesions         Bimanual Exam:  Uterus:  normal size, contour, position, consistency, mobility, non-tender              Adnexa: normal adnexa and no mass, fullness, tenderness              Rectovaginal: Yes.  .  Confirms.              Anus:  normal sphincter tone, no lesions  Chaperone was present for exam.  ASSESSMENT  Incomplete uterovaginal prolapse.  Mild urinary retention.  Symptomatic rectocele.  Status post Laparotomy with LSO for dermoid ovarian cyst.   PLAN  I have had a comprehensive discussion with the patient regarding prolapse and urinary incontinence.  I have provided reading materials from ACOG regarding prolapse and incontinence in general as well as medical and surgical treatment for these conditions. Medical treatments may include physical therapy, pessary use, anticholinergic/antimuscarinic therapy.   We discussed a routes of approach to surgery including:  abdominal sacrocolpopexy with permanent graft, Halban's culdoplasty, and anterior and posterior colporrhaphy, TVT midurethral sling with cystoscopy. She understands that this can be done laparoscopically robotically and that I currently do not offer this approach but could refer her if needed to a provider who does.  She is content with an abdominal approach at this point.   I would recommend concurrent abdominal hysterectomy and removal of her remaining tube and ovary.   We discussed benefits and risks of surgery which include but are not limited to bleeding, infection, damage to surrounding organs, ureteral damage, vaginal pain with intercourse, permanent mesh use which may cause erosion and exposure in the vagina, urethra, bladder or ureters, dyspareunia, possible need for catheterization, reoperation, recurrence of prolapse and incontinence,  DVT, PE, death, and reaction  to anesthesia.    I have discussed surgical expectations regarding the procedures and success rates, outcomes, and recovery.     Patient is considering surgery for next year and will contact the office back in January 2017 when she has an insurance change and would be considering surgical care.  ___40____ minutes face to face time of which over 50% was spent in counseling.      An After Visit Summary was  printed and given to the patient.

## 2015-05-23 NOTE — Patient Instructions (Signed)
Please call when you would like to proceed forward with surgical care.

## 2015-09-27 ENCOUNTER — Telehealth: Payer: Self-pay | Admitting: Obstetrics and Gynecology

## 2015-09-27 DIAGNOSIS — Z1211 Encounter for screening for malignant neoplasm of colon: Secondary | ICD-10-CM

## 2015-09-27 NOTE — Telephone Encounter (Signed)
1. Patient wants to schedule an appointment for a follow up consultation with Dr. Quincy Simmonds to discuss recommended surgery options and other alternatives. She also has questions for Dr. Quincy Simmonds prior to the appointment.  2. Referral for a colonoscopy.

## 2015-09-27 NOTE — Telephone Encounter (Signed)
Spoke with patient. Patient would like to come in to discuss her recommended surgery with Dr.Silva to see if there are any alternatives. Appointment scheduled for 10/03/2015 at 9:30 am. Patient is agreeable to date and time. "Could you pass something on to her for me? My husband does not think that I need to have this done right away. I was hoping she could explain this when we are in that I do need to have this." Advised I will rely this concern to Dr.Silva. Patient is requesting a referral for a colonoscopy. Denies any current problems or concerns. Referral placed to Dr.Mann. Advised patient she will be contacted by our referrals coordinator or Dr.Mann's office directly to schedule her consultation appointment. Patient is agreeable.  Cc: Lerry Liner for referral  Routing to provider for final review. Patient agreeable to disposition. Will close encounter.

## 2015-10-03 ENCOUNTER — Encounter: Payer: Self-pay | Admitting: Obstetrics and Gynecology

## 2015-10-03 ENCOUNTER — Ambulatory Visit (INDEPENDENT_AMBULATORY_CARE_PROVIDER_SITE_OTHER): Payer: BLUE CROSS/BLUE SHIELD | Admitting: Obstetrics and Gynecology

## 2015-10-03 VITALS — HR 68 | Resp 16 | Ht 69.0 in | Wt 175.5 lb

## 2015-10-03 DIAGNOSIS — N813 Complete uterovaginal prolapse: Secondary | ICD-10-CM

## 2015-10-03 NOTE — Progress Notes (Signed)
GYNECOLOGY  VISIT   HPI: 60 y.o.   Married  caucasian  female   G6P6 with Patient's last menstrual period was 09/08/2006.   here for consult on uterovaginal prolapse.  Patient states she is concerned it is causing difficulties daily.  Husband is present for the entire visit.   Feels like the prolapse is worse.  Feels more pressure and like she is sitting on herself.  "Afraid it is going to fall out." Voiding often.  Double voids. Never leaks urine. Has bowel problems.  Using Fiber One which helps.   Is a Biochemist, clinical.  Not in a position to proceed with surgery at this time.  Exam 05/23/15 - Third degree cystocele, second degree uterine prolapse, first degree rectocele.   GYNECOLOGIC HISTORY: Patient's last menstrual period was 09/08/2006. Contraception:postmenopausal Menopausal hormone therapy: None Last mammogram: 05/01/2014 BIRADS Catergory 1 Negative Last pap smear: 11/08/2012 Negative HR HPV Negative        OB History    Gravida Para Term Preterm AB TAB SAB Ectopic Multiple Living   6 6                 Patient Active Problem List   Diagnosis Date Noted  . 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 Prolapse of uterus 12/29/2012  . Cystocele 12/29/2012  . Seizure [facility])     Past Medical History  Diagnosis Date  . Seizure 2007    frontal lobe, none since    Past Surgical History  Procedure Laterality Date  . Tonsillectomy      as a child  . Oophorectomy Left 1986    LSO-Dermoid    Current Outpatient Prescriptions  Medication Sig Dispense Refill  . aspirin 81 MG tablet Take 81 mg by mouth daily.    . cholecalciferol (VITAMIN D) 1000 UNITS tablet Take 5,000 Units by mouth 2 (two) times daily.    Marland Kitchen conjugated estrogens (PREMARIN) vaginal cream Place 1 Applicatorful vaginally daily. Use 1/2 g vaginally every night at bed time for the first 2 weeks, then use 1/2 g vaginally two or three times per week as needed to maintain symptom  relief. (Patient not taking: Reported on 05/23/2015) 60 g 3  . cyanocobalamin 1000 MCG tablet Take 100 mcg by mouth.    . EPITOL 200 MG tablet Take 1 tablet by mouth 3 (three) times daily.     No current facility-administered medications for this visit.     ALLERGIES: Review of patient's allergies indicates no known allergies.  Family History  Problem Relation Age of Onset  . Cancer Maternal Grandfather   . Osteoarthritis Mother   . Spina bifida Brother 0    lived 4 months  . Glaucoma Father 56  . Diabetes Paternal Grandmother     Social History   Social History  . Marital Status: Married    Spouse Name: N/A  . Number of Children: 6  . Years of Education: 12th   Occupational History  . homemaker    Social History Main Topics  . Smoking status: Never Smoker   . Smokeless tobacco: Never Used  . Alcohol Use: No     Comment: drinks coffee and tea daily  . Drug Use: No  . Sexual Activity:    Partners: Male    Birth Control/ Protection: Post-menopausal   Other Topics Concern  . Not on file   Social History Narrative   Patient lives at home with her husband and has 6 children. 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     ROS:  Pertinent items are noted in HPI.  PHYSICAL EXAMINATION:    LMP 09/08/2006    General appearance: alert, cooperative and appears stated age   Abdomen: Pfannenstiel incision, soft, non-tender; bowel sounds normal; no masses,  no organomegaly    Pelvic: External genitalia:  no lesions              Urethra:  normal appearing urethra with no masses, tenderness or lesions              Bartholins and Skenes: normal                 Vagina: normal appearing vagina with normal color and discharge, no lesions.  Complete uterine prolapse and cystocele.  First degree rectocele.               Cervix: no lesions               Bimanual Exam:  Uterus:  normal size, contour, position, consistency, mobility, non-tender              Adnexa:  normal adnexa and no mass, fullness, tenderness              Rectovaginal: Yes.  .  Confirms.              Anus:  normal sphincter tone, no lesions  Chaperone was present for exam.  ASSESSMENT  Complete uterovaginal prolapse.  Has progressive prolapse.   PLAN  Counseled regarding prolapse - etiologies, natural history, signs and symptoms, effects on daily functioning, and treatment options of physical therapy, surgery,and pessary.  I discussed surgical care and pessary care with respect to expectations. Will proceed with pessary care and fitting at follow up appointment.  Premarin cream improves tolerance of vaginal tissues to pessary.  Encouraged avoidance of constipation through dietary modification and stool softeners.   An After Visit Summary was printed and given to the patient.  _25_____ minutes face to face time of which over 50% was spent in counseling.

## 2015-10-10 ENCOUNTER — Encounter: Payer: Self-pay | Admitting: Obstetrics and Gynecology

## 2015-10-10 ENCOUNTER — Ambulatory Visit (INDEPENDENT_AMBULATORY_CARE_PROVIDER_SITE_OTHER): Payer: BLUE CROSS/BLUE SHIELD | Admitting: Obstetrics and Gynecology

## 2015-10-10 VITALS — BP 106/78 | HR 62 | Resp 16 | Ht 69.0 in | Wt 175.0 lb

## 2015-10-10 DIAGNOSIS — N813 Complete uterovaginal prolapse: Secondary | ICD-10-CM | POA: Diagnosis not present

## 2015-10-10 NOTE — Progress Notes (Signed)
GYNECOLOGY  VISIT   HPI: 60 y.o.   Married  caucasian female   G6P6 with Patient's last menstrual period was 09/08/2006.   here for pessary fitting. Denies urinary incontinence ever.   Has progressive prolapse and unable to have surgery at this time.  Patient is the primary person to earn money in their home, and she cannot stop working for a prolonged period of time.  She cleans houses. Is now raising her grandchildren also.   Feels resentful that her husband will not help financially for her to have surgery for prolapse.   GYNECOLOGIC HISTORY: Patient's last menstrual period was 09/08/2006. Contraception:postmenopausal Menopausal hormone therapy: None Last mammogram:05/01/2014 BIRADS Category 1 Negative Last pap smear:11/08/12 Neg Hr HPV neg        OB History    Gravida Para Term Preterm AB TAB SAB Ectopic Multiple Living   6 6                 Patient Active Problem List   Diagnosis Date Noted  . 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 Prolapse of uterus 12/29/2012  . Cystocele 12/29/2012  . Seizure [facility])     Past Medical History  Diagnosis Date  . Seizure [facility]) 2007    frontal lobe, none since    Past Surgical History  Procedure Laterality Date  . Tonsillectomy      as a child  . Oophorectomy Left 1986    LSO-Dermoid    Current Outpatient Prescriptions  Medication Sig Dispense Refill  . aspirin 81 MG tablet Take 81 mg by mouth daily.    . cholecalciferol (VITAMIN D) 1000 UNITS tablet Take 5,000 Units by mouth 2 (two) times daily.    . cyanocobalamin 1000 MCG tablet Take 100 mcg by mouth.    . EPITOL 200 MG tablet Take 1 tablet by mouth 3 (three) times daily.    Marland Kitchen conjugated estrogens (PREMARIN) vaginal cream Place 1 Applicatorful vaginally daily. Use 1/2 g vaginally every night at bed time for the first 2 weeks, then use 1/2 g vaginally two or three times per week as needed to maintain symptom relief. (Patient not taking: Reported on 10/10/2015) 60  g 3   No current facility-administered medications for this visit.     ALLERGIES: Review of patient's allergies indicates no known allergies.  Family History  Problem Relation Age of Onset  . Cancer Maternal Grandfather   . Osteoarthritis Mother   . Spina bifida Brother 0    lived 4 months  . Glaucoma Father 41  . Diabetes Paternal Grandmother     Social History   Social History  . Marital Status: Married    Spouse Name: N/A  . Number of Children: 6  . Years of Education: 12th   Occupational History  . homemaker    Social History Main Topics  . Smoking status: Never Smoker   . Smokeless tobacco: Never Used  . Alcohol Use: No     Comment: drinks coffee and tea daily  . Drug Use: No  . Sexual Activity:    Partners: Male    Birth Control/ Protection: Post-menopausal   Other Topics Concern  . Not on file   Social History Narrative   Patient lives at home with her husband and has 6 children. 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     ROS:  Pertinent items are noted in HPI.  PHYSICAL EXAMINATION:    Ht 5\' 9"  (1.753 m)  LMP 09/08/2006  General appearance: alert, cooperative and appears stated age   Pelvic exam:  Complete uterovaginal prolapse.  #5 pessary ring with support fitted.  Comfortable for patient with maneuvers. #4 ring with support too small and patient is able to push this out.  Chaperone was present for exam.  ASSESSMENT  Complete uterovaginal prolapse.  Local vaginal estrogen use.  PLAN  Counseled regarding pessary usage.  She understands she may have occult incontinence uncovered by the use of the pessary.  Will order a #5 ring with support.  Return to receive pessary and learn how to place and remove.   An After Visit Summary was printed and given to the patient.  ___15___ minutes face to face time of which over 50% was spent in counseling.

## 2015-10-17 ENCOUNTER — Telehealth: Payer: Self-pay

## 2015-10-17 NOTE — Telephone Encounter (Signed)
Spoke with patient and notified we received pessary.  Made appointment for pessary insertion for 10-22-15 9:30am.

## 2015-10-22 ENCOUNTER — Other Ambulatory Visit: Payer: Self-pay

## 2015-10-22 ENCOUNTER — Ambulatory Visit (INDEPENDENT_AMBULATORY_CARE_PROVIDER_SITE_OTHER): Payer: BLUE CROSS/BLUE SHIELD | Admitting: Obstetrics and Gynecology

## 2015-10-22 ENCOUNTER — Encounter: Payer: Self-pay | Admitting: Obstetrics and Gynecology

## 2015-10-22 VITALS — BP 120/74 | HR 60 | Ht 69.0 in | Wt 174.6 lb

## 2015-10-22 DIAGNOSIS — N813 Complete uterovaginal prolapse: Secondary | ICD-10-CM | POA: Diagnosis not present

## 2015-10-22 DIAGNOSIS — Z1231 Encounter for screening mammogram for malignant neoplasm of breast: Secondary | ICD-10-CM

## 2015-10-22 NOTE — Progress Notes (Signed)
Patient ID: Tina Peck, female   DOB: 1955/12/11, 60 y.o.   MRN: [id number] GYNECOLOGY  VISIT   HPI: 60 y.o.   Married  Caucasian  female   G6P6 with Patient's last menstrual period was 09/08/2006.   here for pessary insertion.    Not using vaginal estrogen due to cost.   Colonoscopy consult with Dr. Collene Mares on 11/01/15.  GYNECOLOGIC HISTORY: Patient's last menstrual period was 09/08/2006. Contraception:Postmenopausal Menopausal hormone therapy: None Last mammogram: 05-01-14 Density Cat.B/Neg/BiRads1:[facility].  Patient given phone information for Breast Center.  She will call to schedule her mammogram. Last pap smear: 11-08-12 Neg:Neg HR HPV        OB History    Gravida Para Term Preterm AB TAB SAB Ectopic Multiple Living   6 6                 Patient Active Problem List   Diagnosis Date Noted  . 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 Prolapse of uterus 12/29/2012  . Cystocele 12/29/2012  . Seizure [facility])     Past Medical History  Diagnosis Date  . Seizure [facility]) 2007    frontal lobe, none since    Past Surgical History  Procedure Laterality Date  . Tonsillectomy      as a child  . Oophorectomy Left 1986    LSO-Dermoid    Current Outpatient Prescriptions  Medication Sig Dispense Refill  . aspirin 81 MG tablet Take 81 mg by mouth daily.    . cholecalciferol (VITAMIN D) 1000 UNITS tablet Take 5,000 Units by mouth 2 (two) times daily.    Marland Kitchen conjugated estrogens (PREMARIN) vaginal cream Place 1 Applicatorful vaginally daily. Use 1/2 g vaginally every night at bed time for the first 2 weeks, then use 1/2 g vaginally two or three times per week as needed to maintain symptom relief. 60 g 3  . cyanocobalamin 1000 MCG tablet Take 100 mcg by mouth.    . EPITOL 200 MG tablet Take 1 tablet by mouth 3 (three) times daily.     No current facility-administered medications for this visit.     ALLERGIES: Review of patient's allergies indicates no known  allergies.  Family History  Problem Relation Age of Onset  . Cancer Maternal Grandfather   . Osteoarthritis Mother   . Spina bifida Brother 0    lived 4 months  . Glaucoma Father 67  . Diabetes Paternal Grandmother     Social History   Social History  . Marital Status: Married    Spouse Name: N/A  . Number of Children: 6  . Years of Education: 12th   Occupational History  . homemaker    Social History Main Topics  . Smoking status: Never Smoker   . Smokeless tobacco: Never Used  . Alcohol Use: No     Comment: drinks coffee and tea daily  . Drug Use: No  . Sexual Activity:    Partners: Male    Birth Control/ Protection: Post-menopausal   Other Topics Concern  . Not on file   Social History Narrative   Patient lives at home with her husband and has 6 children. 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     ROS:  Pertinent items are noted in HPI.  PHYSICAL EXAMINATION:    BP 120/74 mmHg  Pulse 60  Ht 5\' 9"  (1.753 m)  Wt 174 lb 9.6 oz (79.198 kg)  BMI 25.77 kg/m2  LMP 09/08/2006    General  appearance: alert, cooperative and appears stated age   Patient received pessary.  Able to do maneuvers and maintain the pessary.  Pessary comfortable.  Able to void prior to discharge.              [place]  #5 ring with support - Lot number [id number] Chaperone was present for exam.  ASSESSMENT  Complete uterovaginal prolapse.  PLAN  Counseled regarding pessary use and care.  Follow up in 2 weeks.    An After Visit Summary was printed and given to the patient.  __15____ minutes face to face time of which over 50% was spent in counseling.

## 2015-11-05 ENCOUNTER — Ambulatory Visit (INDEPENDENT_AMBULATORY_CARE_PROVIDER_SITE_OTHER): Payer: BLUE CROSS/BLUE SHIELD | Admitting: Obstetrics and Gynecology

## 2015-11-05 ENCOUNTER — Encounter: Payer: Self-pay | Admitting: Obstetrics and Gynecology

## 2015-11-05 VITALS — BP 110/70 | HR 70 | Ht 69.0 in | Wt 176.8 lb

## 2015-11-05 DIAGNOSIS — N813 Complete uterovaginal prolapse: Secondary | ICD-10-CM

## 2015-11-05 DIAGNOSIS — Z4689 Encounter for fitting and adjustment of other specified devices: Secondary | ICD-10-CM

## 2015-11-05 NOTE — Progress Notes (Signed)
Patient ID: Tina Peck, female   DOB: September 26, 1955, 60 y.o.   MRN: [id number] GYNECOLOGY  VISIT   HPI: 60 y.o.   Married  Caucasian  female   G6P6 with Patient's last menstrual period was 09/08/2006.   here for pessary check.     Since starting pessary use, having a lot of rectal pressure.  Notes constipation now. Uses Miralax.  Takes the pessary out at night.   Has difficulty voiding.  Does not leak urine with or without pessary in place.   Interested in proceeding with surgery.  Has friends will be helping her with the finances of this.  Would like to proceed with surgery in the third week of June.   Prefers June 20th if possible.  No prior urodynamic testing done.   GYNECOLOGIC HISTORY: Patient's last menstrual period was 09/08/2006. Contraception:Postmenopausal Menopausal hormone therapy: None Last mammogram: 05-01-14 Density Cat.B/Neg/BiRads1:[facility]. Patient given phone information for Breast Center. She will call to schedule her mammogram.  Has appt. 11-19-15. Last pap smear: 11-08-12 Neg:Neg HR HPV        OB History    Gravida Para Term Preterm AB TAB SAB Ectopic Multiple Living   6 6                 Patient Active Problem List   Diagnosis Date Noted  . 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 Prolapse of uterus 12/29/2012  . Cystocele 12/29/2012  . Seizure [facility])     Past Medical History  Diagnosis Date  . Seizure [facility]) 2007    frontal lobe, none since    Past Surgical History  Procedure Laterality Date  . Tonsillectomy      as a child  . Oophorectomy Left 1986    LSO-Dermoid    Current Outpatient Prescriptions  Medication Sig Dispense Refill  . aspirin 81 MG tablet Take 81 mg by mouth daily.    . cholecalciferol (VITAMIN D) 1000 UNITS tablet Take 5,000 Units by mouth 2 (two) times daily.    Marland Kitchen conjugated estrogens (PREMARIN) vaginal cream Place 1 Applicatorful vaginally daily. Use 1/2 g vaginally every night at bed time for  the first 2 weeks, then use 1/2 g vaginally two or three times per week as needed to maintain symptom relief. 60 g 3  . cyanocobalamin 1000 MCG tablet Take 100 mcg by mouth.    . EPITOL 200 MG tablet Take 1 tablet by mouth 3 (three) times daily.     No current facility-administered medications for this visit.     ALLERGIES: Review of patient's allergies indicates no known allergies.  Family History  Problem Relation Age of Onset  . Cancer Maternal Grandfather   . Osteoarthritis Mother   . Spina bifida Brother 0    lived 4 months  . Glaucoma Father 67  . Diabetes Paternal Grandmother     Social History   Social History  . Marital Status: Married    Spouse Name: N/A  . Number of Children: 6  . Years of Education: 12th   Occupational History  . homemaker    Social History Main Topics  . Smoking status: Never Smoker   . Smokeless tobacco: Never Used  . Alcohol Use: No     Comment: drinks coffee and tea daily  . Drug Use: No  . Sexual Activity:    Partners: Male    Birth Control/ Protection: Post-menopausal   Other Topics Concern  . Not on file   Social History  Narrative   Patient lives at home with her husband and has 6 children. 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     ROS:  Pertinent items are noted in HPI.  PHYSICAL EXAMINATION:    Ht 5\' 9"  (1.753 m)  LMP 09/08/2006    General appearance: alert, cooperative and appears stated age   Abdomen: Pfannenstiel incision, soft, non-tender; bowel sounds normal; no masses,  no organomegaly   Pelvic: External genitalia:  no lesions              Urethra:  normal appearing urethra with no masses, tenderness or lesions              Bartholins and Skenes: normal                 Vagina: normal appearing vagina with normal color and discharge, no lesions.  No erythema noted.              Cervix: no lesions                Bimanual Exam:  Uterus:  normal size, contour, position, consistency, mobility,  non-tender              Adnexa: no mass, fullness, tenderness                Pessary removed, cleansed, and replaced.   Chaperone was present for exam.  ASSESSMENT  Complete uterovaginal prolapse. Pessary not satisfactory to patient.   PLAN  Continue pessary use as tolerated. Proceed with urodynamic testing.  Procedure explained.  Discussion of surgical plan of total abdominal hysterectomy with bilateral salpingo-oophorectomy, abdominal sacrocolpopexy, Halban's culdoplasty, anterior and posterior colporrhaphy, and TVT midurethral sling and cystoscopy.   Patient would like to proceed forward with planning of surgery.   Will precert surgery and urodynamics testing.    An After Visit Summary was printed and given to the patient.  __25____ minutes face to face time of which over 50% was spent in counseling.

## 2015-11-16 ENCOUNTER — Telehealth: Payer: Self-pay | Admitting: Obstetrics and Gynecology

## 2015-11-16 NOTE — Telephone Encounter (Signed)
Patient has something else that she wants to let [place] know. Best # to reach: [phone]

## 2015-11-16 NOTE — Telephone Encounter (Signed)
Spoke with patient regarding surgery benefits. Patient is agreeable. Patient advises she will come by the office on Monday 11/19/15 to pay her surgery deposit

## 2015-11-16 NOTE — Telephone Encounter (Signed)
Spoke with patient regarding benefits for urodynamics. Pt is agreeable to the benefits and is ready to schedule. Forwarding to Triage/Tracy to contact the patient and schedule

## 2015-11-16 NOTE — Telephone Encounter (Signed)
Spoke with pt regarding benefit for surgery. Patient understood and agreeable. Patient ready to schedule. Patient provided surgery deposit over the phone. Patient aware this is professional benefit only. Patient aware will be contacted by hospital for separate benefits. Staff message to Sally for scheduling °

## 2015-11-19 ENCOUNTER — Ambulatory Visit
Admission: RE | Admit: 2015-11-19 | Discharge: 2015-11-19 | Disposition: A | Discharge status: Home / Self Care | Payer: BLUE CROSS/BLUE SHIELD | Source: Ambulatory Visit

## 2015-11-19 DIAGNOSIS — Z1231 Encounter for screening mammogram for malignant neoplasm of breast: Secondary | ICD-10-CM

## 2015-11-22 NOTE — Telephone Encounter (Signed)
Message left to return call to [place] at [phone] at home number. Mobile number not in service.   Will offer Urodynamics date of 12/26/15.   Needs instructions and scheduled follow up appointments/UA.

## 2015-11-26 NOTE — Telephone Encounter (Signed)
Surgery is scheduled for 03-04-16 at 730 at [facility] as patient requested. Will complete surgical instructions and schedule pre and post op appointments at urodynamic testing appointment.

## 2015-12-04 NOTE — Telephone Encounter (Signed)
Message left to return call to Tina Peck at [phone].    

## 2015-12-06 ENCOUNTER — Encounter: Payer: Self-pay | Admitting: Emergency Medicine

## 2015-12-06 NOTE — Telephone Encounter (Signed)
Spoke with patient and urodynamics instructions given. Scheduled urodynamics procedure for  12/26/15 at 1130 Advised to stop all bladder medications one week prior to procedure, patient states she is not on any medications. Arrive with comfortably full bladder.  Advised will need pre procedure UA to check for any infection prior. Scheduled for 12/24/15 at 0900   Brief description of procedure given. Letter to home address with instructions and appointments.  Patient verbalizes understanding of instructions and agreeable to appointments as scheduled.  Routing to provider for final review. Patient agreeable to disposition. Will close encounter.

## 2015-12-06 NOTE — Telephone Encounter (Signed)
Call to patient, Advised of surgery scheduled for 03-04-16 at 0730. Need to proceed with scheduling of urodynamic testing. Call transferred to [facility] for scheduling.

## 2015-12-24 ENCOUNTER — Ambulatory Visit (INDEPENDENT_AMBULATORY_CARE_PROVIDER_SITE_OTHER): Payer: BLUE CROSS/BLUE SHIELD

## 2015-12-24 ENCOUNTER — Other Ambulatory Visit: Payer: Self-pay | Admitting: Gastroenterology

## 2015-12-24 VITALS — BP 116/64 | HR 76 | Resp 16 | Ht 69.0 in | Wt 177.0 lb

## 2015-12-24 DIAGNOSIS — Z01812 Encounter for preprocedural laboratory examination: Secondary | ICD-10-CM

## 2015-12-24 DIAGNOSIS — R1310 Dysphagia, unspecified: Secondary | ICD-10-CM

## 2015-12-24 LAB — POCT URINALYSIS DIPSTICK
Bilirubin, UA: NEGATIVE
Blood, UA: NEGATIVE
GLUCOSE UA: NEGATIVE
Ketones, UA: NEGATIVE
LEUKOCYTES UA: NEGATIVE
NITRITE UA: NEGATIVE
PROTEIN UA: NEGATIVE
UROBILINOGEN UA: NEGATIVE
pH, UA: 7

## 2015-12-24 NOTE — Progress Notes (Signed)
Patient in office for pre-urodynamic UA. Patient's urine is clear.

## 2015-12-25 ENCOUNTER — Encounter (HOSPITAL_COMMUNITY): Payer: Self-pay | Admitting: *Deleted

## 2015-12-26 ENCOUNTER — Ambulatory Visit (INDEPENDENT_AMBULATORY_CARE_PROVIDER_SITE_OTHER): Payer: BLUE CROSS/BLUE SHIELD | Admitting: Obstetrics and Gynecology

## 2015-12-26 VITALS — BP 138/80 | HR 84 | Resp 16 | Wt 176.4 lb

## 2015-12-26 DIAGNOSIS — N813 Complete uterovaginal prolapse: Secondary | ICD-10-CM | POA: Diagnosis not present

## 2015-12-26 NOTE — Progress Notes (Signed)
Tina Peck is a 60 y.o. female Who presents today for urodynamics testing, ordered by Dr. Quincy Simmonds.   Allergies and medications reviewed.  Denies complaints today. No urinary complaints.   Urine Micro exam: negative for WBC's or RBC's, okay to proceed per Dr. Quincy Simmonds.  Patient with complete prolapse. Has pessary in place. Does not have stress incontinence. No urinary leakage per patient.   Urodynamics testing initiated. Lumax Bladder Catheter #10 [place] and lumax Abdominal Catheter #10 [place].   Post void residual 50 ml.   Urethral catheter placed without issue. Rectal catheter placed without issue.   Urodynamics testing completed. Please see scanned Patient summary report in Epic. Procedure completed and patient tolerated well without complaints. Patient scheduled for follow up office visit with Dr. Quincy Simmonds to discuss results. Patient agreeable.   Patient given post procedure instructions and verbalized understanding.  AVS Printed.  You may have a mild bladder and rectal discomfort for a few hours after the test. You may experience some frequent urination and slight burning the first few times you urinate after the test. Rarely, the urine may be blood tinged. These are both due to catheter placements and resolve quickly. You should call our office immediately if you have signs of infection, which may include bladder pain, urinary urgency, fever, or burning during urination. We do encourage you to drink plenty of water after the test.

## 2015-12-26 NOTE — Patient Instructions (Signed)
After your procedure:   You may have a mild bladder and rectal discomfort for a few hours after the test. . You may experience some frequent urination and slight burning the first few times you urinate after the test. Rarely, the urine may be blood tinged. These are both due to catheter placements and resolve quickly.  . You should call our office immediately if you have signs of infection, which may include bladder pain, urinary urgency, fever, or burning during urination. .  We do encourage you to drink plenty of water after completion of the test today.   Please call our office with any concerns or questions.   [facility] [address], [address] [place], [place] [address] PH:  [phone]; Fax:  [phone] 

## 2016-01-02 ENCOUNTER — Encounter: Payer: Self-pay | Admitting: Obstetrics and Gynecology

## 2016-01-02 ENCOUNTER — Ambulatory Visit (INDEPENDENT_AMBULATORY_CARE_PROVIDER_SITE_OTHER): Payer: BLUE CROSS/BLUE SHIELD | Admitting: Obstetrics and Gynecology

## 2016-01-02 VITALS — BP 120/64 | HR 64 | Ht 69.0 in | Wt 171.6 lb

## 2016-01-02 DIAGNOSIS — N813 Complete uterovaginal prolapse: Secondary | ICD-10-CM | POA: Diagnosis not present

## 2016-01-02 DIAGNOSIS — R140 Abdominal distension (gaseous): Secondary | ICD-10-CM

## 2016-01-02 NOTE — Progress Notes (Signed)
Patient ID: Tina Peck, female   DOB: 1956/04/15, 60 y.o.   MRN: [id number] GYNECOLOGY  VISIT   HPI: 60 y.o.   Married  Caucasian  female   G6P6 with Patient's last menstrual period was 09/08/2006.   here to discuss results of urodynamics testing.   Multichannel urodynamic testing 12/26/15:  Pessary used to reduce the prolapse.  Uroflow study:  Voided voided 532 cc.  PVR 50 cc.  Continuous void. CMG:  S1 206 cc, S2 438 cc, S3 596 cc.  No LPP noted.  Detrusor contractions noted as bladder filled.  UPP:  Appears to be about 40 - 50 cm H20.  No number recorded by computer, but profile is recorded.  Pressure flow:  Pdet max 62 cm H20.  Void 678 cc.   Valsalva maneuver with voiding.  No genuine stress incontinence noted.  Detrusor contractions with bladder filling.   Patient states no urinary incontinence with coughing, sneezing, exercising with either pessary in or out.  Void much better with the pessary in.  Does not need to double void.   Removes pessary to have bowel movements.  Pushes and presses around the rectum to have bowel movements.  Stool softener helps. No fecal incontinence.   Had colonoscopy which showed hemorrhoids.  No polyps.   Reports abdominal bloating and concern for this.   GYNECOLOGIC HISTORY: Patient's last menstrual period was 09/08/2006. Contraception:  Postmenopausal Menopausal hormone therapy:  None Last mammogram:  11-21-15 Density B/Neg/BiRads1:[facility] Last pap smear:  11-08-12 Neg:Neg HR HPV         OB History    Gravida Para Term Preterm AB TAB SAB Ectopic Multiple Living   6 6                 Patient Active Problem List   Diagnosis Date Noted  . 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 Prolapse of uterus 12/29/2012  . Cystocele 12/29/2012  . Seizure [facility])     Past Medical History  Diagnosis Date  . Seizure [facility]) 2007    frontal lobe, none since 2007    Past Surgical History  Procedure Laterality Date  .  Tonsillectomy      as a child  . Oophorectomy Left 1986    LSO-Dermoid    Current Outpatient Prescriptions  Medication Sig Dispense Refill  . aspirin 81 MG tablet Take 81 mg by mouth daily.    . cholecalciferol (VITAMIN D) 1000 UNITS tablet Take 5,000 Units by mouth 2 (two) times daily.    . cyanocobalamin 1000 MCG tablet Take 100 mcg by mouth.    . EPITOL 200 MG tablet Take 1 tablet by mouth 3 (three) times daily.     No current facility-administered medications for this visit.     ALLERGIES: Review of patient's allergies indicates no known allergies.  Family History  Problem Relation Age of Onset  . Cancer Maternal Grandfather   . Osteoarthritis Mother   . Spina bifida Brother 0    lived 4 months  . Glaucoma Father 37  . Diabetes Paternal Grandmother     Social History   Social History  . Marital Status: Married    Spouse Name: N/A  . Number of Children: 6  . Years of Education: 12th   Occupational History  . homemaker    Social History Main Topics  . Smoking status: Never Smoker   . Smokeless tobacco: Never Used  . Alcohol Use: No     Comment: drinks coffee  and tea daily  . Drug Use: No  . Sexual Activity:    Partners: Male    Birth Control/ Protection: Post-menopausal   Other Topics Concern  . Not on file   Social History Narrative   Patient lives at home with her husband and has 6 children. 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     ROS:  Pertinent items are noted in HPI.  PHYSICAL EXAMINATION:    BP 120/64 mmHg  Pulse 64  Ht 5\' 9"  (1.753 m)  Wt 171 lb 9.6 oz (77.837 kg)  BMI 25.33 kg/m2  LMP 09/08/2006    General appearance: alert, cooperative and appears stated age      ASSESSMENT  Complete uterovaginal prolapse.  No genuine stress incontinence. Abdominal bloating.  Status post LSO for dermoid.   PLAN  Patient and I reviewed her intended surgical procedure which will likely include a total abdominal hysterectomy with  bilateral salpingo-oophorectomy, abdominosacrocolpopexy, Halban's culdoplasty, anterior and posterior colporrhaphy, TVT Exact midurethral sling and cystoscopy. We discussed permanent mesh material use for the sacrocolpopexy and midurethral sling.  We did discuss permanent suture use for the Halban's culdoplasty.  I discussed slings being 85 - 90% effective in treatment of stress incontinence, which in this case is prophylactic against future stress incontinence following the sacrocolpopexy. I discussed risks of slings including but not limited to erosions and exposure, dyspareunia, cystotomy, urinary retention and slower voiding, increase in urgency symptoms, urinary tract infections, bleeding, infection, damage to surrounding organs, reaction to anesthesia, DVT, PE, death, need for reoperation, and recurrence of incontinence and prolapse.  Patient wishes to proceed forward with surgical care.  She will return for a pelvic ultrasound to check her pelvic anatomy.     An After Visit Summary was printed and given to the patient.  __25____ minutes face to face time of which over 50% was spent in counseling.

## 2016-01-03 NOTE — Progress Notes (Signed)
Multichannel urodynamic testing:  Pessary used to reduce the prolapse.  Uroflow study:  Voided voided 532 cc.  PVR 50 cc.  Continuous void. CMG:  S1 206 cc, S2 438 cc, S3 596 cc.  No LPP noted.  Detrusor contractions noted as bladder filled.  UPP:  Appears to be about 40 - 50 cm H20.  No number recorded by computer, but profile is recorded.  Pressure flow:  Pdet max 62 cm H20.  Void 678 cc.   Valsalva maneuver with voiding.  No genuine stress incontinence noted.  Detrusor contractions with bladder filling.

## 2016-01-07 ENCOUNTER — Other Ambulatory Visit: Payer: BLUE CROSS/BLUE SHIELD

## 2016-01-08 ENCOUNTER — Telehealth: Payer: Self-pay | Admitting: Obstetrics and Gynecology

## 2016-01-08 NOTE — Telephone Encounter (Signed)
Spoke with pt regarding benefit for ultrasound. Patient understood and agreeable. Patient ready to schedule. Patient scheduled 01/17/16 with Dr Quincy Simmonds. Pt aware of arrival date and time. Pt aware of 72 hours cancellation policy with [id number] fee. No further questions. Ok to close

## 2016-01-14 ENCOUNTER — Other Ambulatory Visit: Payer: BLUE CROSS/BLUE SHIELD

## 2016-01-17 ENCOUNTER — Encounter: Payer: Self-pay | Admitting: Obstetrics and Gynecology

## 2016-01-17 ENCOUNTER — Ambulatory Visit (INDEPENDENT_AMBULATORY_CARE_PROVIDER_SITE_OTHER): Payer: BLUE CROSS/BLUE SHIELD | Admitting: Obstetrics and Gynecology

## 2016-01-17 ENCOUNTER — Ambulatory Visit (INDEPENDENT_AMBULATORY_CARE_PROVIDER_SITE_OTHER): Payer: BLUE CROSS/BLUE SHIELD

## 2016-01-17 VITALS — BP 130/76 | HR 66 | Ht 69.0 in | Wt 171.0 lb

## 2016-01-17 DIAGNOSIS — R140 Abdominal distension (gaseous): Secondary | ICD-10-CM | POA: Diagnosis not present

## 2016-01-17 DIAGNOSIS — D259 Leiomyoma of uterus, unspecified: Secondary | ICD-10-CM

## 2016-01-17 NOTE — Progress Notes (Signed)
Patient ID: Tina Peck, female   DOB: 1956-07-06, 60 y.o.   MRN: [id number]  Subjective  60 y.o. G66P6 Married Caucasian female here for pelvic ultrasound for abdominal bloating.  Status post LSO for dermoid cyst.   Planning prolapse surgery.   Having two bowls of Fiber One per day.  Takes a stool softener daily.  These help with constipation.   Patient's last menstrual period was 09/08/2006.  Objective   Pelvic ultrasound images and report reviewed with patient.  Uterus - 5 mm fibroid.  EMS - 1.66 mm.  Ovaries - right ovary normal.  Left ovary absent.  Free fluid - no  Assessment  Abdominal bloating.  Small uterine fibroid. Hx LSO for dermoid cyst.   Plan  Discussion of reassuring anatomy on pelvic ultrasound.  Brief discussion of uterine fibroid and the benign nature of these.  OK to proceed with surgery as scheduled.  Modify diet to control bloating symptoms.  May need to reduce fiber and use Colace 100 mg daily to help with bowel function.  Return for preop visit.   10 minutes face to face time of which over 50% was spent in counseling.

## 2016-02-11 ENCOUNTER — Ambulatory Visit (INDEPENDENT_AMBULATORY_CARE_PROVIDER_SITE_OTHER): Payer: BLUE CROSS/BLUE SHIELD | Admitting: Obstetrics and Gynecology

## 2016-02-11 ENCOUNTER — Encounter: Payer: Self-pay | Admitting: Obstetrics and Gynecology

## 2016-02-11 VITALS — BP 108/64 | HR 70 | Ht 69.0 in | Wt 174.0 lb

## 2016-02-11 DIAGNOSIS — Z8669 Personal history of other diseases of the nervous system and sense organs: Secondary | ICD-10-CM

## 2016-02-11 DIAGNOSIS — Z87898 Personal history of other specified conditions: Secondary | ICD-10-CM

## 2016-02-11 DIAGNOSIS — N813 Complete uterovaginal prolapse: Secondary | ICD-10-CM | POA: Diagnosis not present

## 2016-02-11 NOTE — Progress Notes (Signed)
Patient ID: Tina Peck, female   DOB: 19-Oct-1955, 60 y.o.   MRN: [id number] GYNECOLOGY  VISIT   HPI: 60 y.o.   Married  Caucasian  female   G6P6 with Patient's last menstrual period was 09/08/2006.   here for surgical consult.   Scheduled for surgery for complete uterovaginal prolapse.  Pessary is not satisfactory.  Denies GSI.  Does manipulation to have BMs.   Had pelvic ultrasound for abdominal bloating and this showed a normal uterus and right ovary.  Hx LSO for dermoid.   Patient is taking her antiseizure medication only twice a day and not three times a day.  She will start 3 times a day starting today.  Has never had another seizure.   Never started the vaginal estrogen cream due to cost.  GYNECOLOGIC HISTORY: Patient's last menstrual period was 09/08/2006. Contraception:  Postmenopausal Menopausal hormone therapy:  none Last mammogram:  11-21-15 Density B/Neg/BiRads1:[facility] Last pap smear:   11-08-12 Neg:Neg HR HPV        OB History    Gravida Para Term Preterm AB TAB SAB Ectopic Multiple Living   6 6                 Patient Active Problem List   Diagnosis Date Noted  . 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 Prolapse of uterus 12/29/2012  . Cystocele 12/29/2012  . Seizure [facility])     Past Medical History  Diagnosis Date  . Seizure [facility]) 2007    frontal lobe, none since 2007    Past Surgical History  Procedure Laterality Date  . Tonsillectomy      as a child  . Oophorectomy Left 1986    LSO-Dermoid    Current Outpatient Prescriptions  Medication Sig Dispense Refill  . aspirin 81 MG tablet Take 81 mg by mouth daily.    . cholecalciferol (VITAMIN D) 1000 UNITS tablet Take 5,000 Units by mouth 2 (two) times daily.    . cyanocobalamin 1000 MCG tablet Take 100 mcg by mouth.    . EPITOL 200 MG tablet Take 1 tablet by mouth 3 (three) times daily.     No current facility-administered medications for this visit.     ALLERGIES:  Review of patient's allergies indicates no known allergies.  Family History  Problem Relation Age of Onset  . Cancer Maternal Grandfather   . Osteoarthritis Mother   . Spina bifida Brother 0    lived 4 months  . Glaucoma Father 30  . Diabetes Paternal Grandmother     Social History   Social History  . Marital Status: Married    Spouse Name: N/A  . Number of Children: 6  . Years of Education: 12th   Occupational History  . homemaker    Social History Main Topics  . Smoking status: Never Smoker   . Smokeless tobacco: Never Used  . Alcohol Use: No     Comment: drinks coffee and tea daily  . Drug Use: No  . Sexual Activity:    Partners: Male    Birth Control/ Protection: Post-menopausal   Other Topics Concern  . Not on file   Social History Narrative   Patient lives at home with her husband and has 6 children. 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     ROS:  Pertinent items are noted in HPI.  PHYSICAL EXAMINATION:    BP 108/64 mmHg  Pulse 70  Ht 5\' 9"  (1.753 m)  Wt 174  lb (78.926 kg)  BMI 25.68 kg/m2  LMP 09/08/2006    General appearance: alert, cooperative and appears stated age Head: Normocephalic, without obvious abnormality, atraumatic Neck: no adenopathy, supple, symmetrical, trachea midline and thyroid normal to inspection and palpation Lungs: clear to auscultation bilaterally   Heart: regular rate and rhythm Abdomen: incision(s):Yes.  , ____Pfannenstiel incision________  soft, non-tender, no masses,  no organomegaly Extremities: extremities normal, atraumatic, no cyanosis or edema Skin: Skin color, texture, turgor normal.  2.5 cm vesicular and erythematous area of left abdominal wall.  Lymph nodes: Cervical, supraclavicular, and axillary nodes normal. No abnormal inguinal nodes palpated Neurologic: Grossly normal  Pelvic: External genitalia:  no lesions              Urethra:  normal appearing urethra with no masses, tenderness or  lesions              Bartholins and Skenes: normal                 Vagina: normal appearing vagina with normal color and discharge, no lesions.  Third degree cystocele, second degree uterine prolapse, and second degree rectocele.              Cervix: no lesions         Bimanual Exam:  Uterus:  normal size, contour, position, consistency, mobility, non-tender              Adnexa: normal adnexa and no mass, fullness, tenderness              Rectal exam: Yes.  .  Confirms.              Anus:  normal sphincter tone, no lesions  Pessary ring with support removed, cleansed, and replaced.   Chaperone was present for exam.  ASSESSMENT  Complete uterovaginal prolapse.  Status post LSO for dermoid.  Seizure disorder.  PLAN  Discussion of dermoid cysts as patient had questions about them.  Proceed with total abdominal hysterectomy with right salpingo-oophorectomy, abdominal sacrocolpopexy, Halban's culdoplasty, anterior and posterior colporrhaphy, TVT Exact midurethral sling, and cystoscopy.   Risks, benefits, and alternative discussed with the patient who wishes to proceed.  Patient understands that permanent mesh and permanent suture is going to be used for her prolapse and incontinence surgery.  Surgical expectations and outcome discussed.  She indicates understanding of her procedure. She will take an enema the night before surgery.  Decreased activity and no lifting over 10 pounds and no sexual activity for 3 months following the procedure. She will increase her Epitol to tid.  We discussed the importance of reducing seizure risk.   An After Visit Summary was printed and given to the patient.  __25____ minutes face to face time of which over 50% was spent in counseling.

## 2016-02-12 ENCOUNTER — Encounter: Payer: Self-pay | Admitting: Obstetrics and Gynecology

## 2016-02-19 NOTE — Patient Instructions (Addendum)
Your procedure is scheduled on:  Tuesday, June 27  Enter through the [employer] of [facility] at:  [employer] up the phone at the desk and dial [phone].  Call this number if you have problems the morning of surgery: [phone].  Remember: Do NOT eat or drink after midnight Monday, 6/26  Take these medicines the morning of surgery with a SIP OF WATER:  Epitol  Do NOT wear jewelry (body piercing), metal hair clips/bobby pins, make-up, or nail polish. Do NOT wear lotions, powders, or perfumes.  You may wear deoderant. Do NOT shave for 48 hours prior to surgery. Do NOT bring valuables to the hospital. Bridgework may not be worn into surgery.  Leave suitcase in car.  After surgery it may be brought to your room.  For patients admitted to the hospital, checkout time is 11:00 AM the day of discharge. Home with husband Simona Huh cell [phone]

## 2016-02-20 ENCOUNTER — Encounter (HOSPITAL_COMMUNITY): Payer: Self-pay

## 2016-02-20 ENCOUNTER — Other Ambulatory Visit (HOSPITAL_COMMUNITY): Payer: BLUE CROSS/BLUE SHIELD

## 2016-02-20 ENCOUNTER — Encounter (HOSPITAL_COMMUNITY)
Admission: RE | Admit: 2016-02-20 | Discharge: 2016-02-20 | Disposition: A | Discharge status: Home / Self Care | Payer: BLUE CROSS/BLUE SHIELD | Source: Ambulatory Visit | Attending: Obstetrics and Gynecology | Admitting: Obstetrics and Gynecology

## 2016-02-20 DIAGNOSIS — Z01812 Encounter for preprocedural laboratory examination: Secondary | ICD-10-CM | POA: Diagnosis not present

## 2016-02-20 DIAGNOSIS — Z01810 Encounter for preprocedural cardiovascular examination: Secondary | ICD-10-CM | POA: Diagnosis present

## 2016-02-20 LAB — BASIC METABOLIC PANEL
ANION GAP: 7 (ref 5–15)
BUN: 9 mg/dL (ref 6–20)
CALCIUM: 8.7 mg/dL — AB (ref 8.9–10.3)
CHLORIDE: 101 mmol/L (ref 101–111)
CO2: 28 mmol/L (ref 22–32)
CREATININE: 0.54 mg/dL (ref 0.44–1.00)
GFR calc non Af Amer: >60 mL/min (ref >60)
Glucose, Bld: 109 mg/dL — ABNORMAL HIGH (ref 65–99)
Potassium: 3.7 mmol/L (ref 3.5–5.1)
SODIUM: 136 mmol/L (ref 135–145)

## 2016-02-20 LAB — CBC
HCT: 41.4 % (ref 36.0–46.0)
Hemoglobin: 14.2 g/dL (ref 12.0–15.0)
MCH: 30.4 pg (ref 26.0–34.0)
MCHC: 34.3 g/dL (ref 30.0–36.0)
MCV: 88.7 fL (ref 78.0–100.0)
PLATELETS: 308 10*3/uL (ref 150–400)
RBC: 4.67 MIL/uL (ref 3.87–5.11)
RDW: 12.9 % (ref 11.5–15.5)
WBC: 5.7 10*3/uL (ref 4.0–10.5)

## 2016-02-29 ENCOUNTER — Telehealth: Payer: Self-pay | Admitting: Obstetrics and Gynecology

## 2016-02-29 ENCOUNTER — Telehealth: Payer: Self-pay | Admitting: Diagnostic Neuroimaging

## 2016-02-29 NOTE — Telephone Encounter (Signed)
I was called by nurse Gay Filler) regarding request for old EKG possibly done at [facility] in 2007. I looked in the old EMR system and did not find any EKG report or documentation. I informed nurse Gay Filler).    Penni Bombard, MD [id number], [id number] PM Certified in Neurology, Neurophysiology and Neuroimaging  [facility] [address] [place], [place] [address] [phone]

## 2016-02-29 NOTE — Telephone Encounter (Signed)
Call to patient. Call dropped while speaking to patient and unable to get back to her x 2 attempts. Call goes directly to voice mail. Left message that have been unsuccessful with PCP and [facility] but checking with [facility] now. Call back if any other suggestions.

## 2016-02-29 NOTE — Telephone Encounter (Signed)
EKG's received from [facility] and sent to your desk for review.

## 2016-02-29 NOTE — Telephone Encounter (Signed)
Stat request for any and all EKG results faxed to [facility] medical records.

## 2016-02-29 NOTE — Telephone Encounter (Signed)
Dr Leta Baptist from [facility] returned call. He reviewed patient's record in their old computer system from June 2007. They were consulted and did EEG and lab work but no EKG.

## 2016-02-29 NOTE — Telephone Encounter (Signed)
EKG received from [name], anesthesia, at [facility].  No change in EKG.  OK to proceed with surgery.   Patient informed by me just now by phone per DPI.  She is appreciative of the follow through on this.   She may see cardiology after surgery in a preventative fashion to reduce risk of first events due to her father's family history.   Encounter closed.

## 2016-02-29 NOTE — Telephone Encounter (Signed)
Call in follow up to her EKG that was done preop.  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  Sees [facility] in [place] [phone]. Possible that she had an EKG there.  Thinks she may also have had an EKG in 2007 when she had her seizure.  Office was [facility].   She does not have any history of chest pain with exertion.  Walks regularly.  Never short of breath.   States FH of heart disease in her father's family.   Our office will follow up on these possible EKGs, contact Dr. Glennon Mac, anesthesiology, at [facility] [phone]), and follow up with the patient by the end of the business day today.

## 2016-02-29 NOTE — Telephone Encounter (Addendum)
Update provided to patient. Advised will call back with more information at end of day. Patient requesting to repeat EKG if needed on Monday before surgery would be canceled.

## 2016-02-29 NOTE — Telephone Encounter (Signed)
[place] calling with information she went to [facility] ER June 2007 and they may have her EKG.  Patient is asking how long an EKG should take to preform?

## 2016-02-29 NOTE — Telephone Encounter (Signed)
Call to [facility] at [place], spoke to [place]. She states they have no documentation of EKG being done in their office or or being done externally and sent to them. Call to [facility], their office is closed for the day. Left message with answering service requesting call into back line number.

## 2016-03-03 MED ORDER — DEXTROSE 5 % IV SOLN
2.0000 g | INTRAVENOUS | Status: AC
  Administered 2016-03-04: 2 g via INTRAVENOUS
  Filled 2016-03-03: qty 2

## 2016-03-03 NOTE — Anesthesia Preprocedure Evaluation (Addendum)
Anesthesia Evaluation  Patient identified by MRN, date of birth, ID band Patient awake    Reviewed: Allergy & Precautions, H&P , NPO status , Patient's Chart, lab work & pertinent test results  Airway Mallampati: II  TM Distance: >3 FB Neck ROM: full    Dental  (+) Dental Advisory Given, Caps All upper front are capped:   Pulmonary neg pulmonary ROS,    Pulmonary exam normal breath sounds clear to auscultation       Cardiovascular Exercise Tolerance: Good negative cardio ROS Normal cardiovascular exam Rhythm:regular Rate:Normal     Neuro/Psych Seizures -, Well Controlled,  One seizure 2007 negative neurological ROS  negative psych ROS   GI/Hepatic negative GI ROS, Neg liver ROS,   Endo/Other  negative endocrine ROS  Renal/GU negative Renal ROS  negative genitourinary   Musculoskeletal   Abdominal   Peds  Hematology negative hematology ROS (+)   Anesthesia Other Findings   Reproductive/Obstetrics negative OB ROS                            Anesthesia Physical Anesthesia Plan  ASA: II  Anesthesia Plan: General   Post-op Pain Management:    Induction:   Airway Management Planned: Oral ETT  Additional Equipment:   Intra-op Plan:   Post-operative Plan: Extubation in OR  Informed Consent: I have reviewed the patients History and Physical, chart, labs and discussed the procedure including the risks, benefits and alternatives for the proposed anesthesia with the patient or authorized representative who has indicated his/her understanding and acceptance.   Dental Advisory Given  Plan Discussed with: CRNA  Anesthesia Plan Comments:         Anesthesia Quick Evaluation

## 2016-03-04 ENCOUNTER — Encounter (HOSPITAL_COMMUNITY)
Admission: RE | Disposition: A | Discharge status: Home / Self Care | Payer: Self-pay | Source: Ambulatory Visit | Attending: Obstetrics and Gynecology

## 2016-03-04 ENCOUNTER — Inpatient Hospital Stay (HOSPITAL_COMMUNITY): Payer: BLUE CROSS/BLUE SHIELD | Admitting: Anesthesiology

## 2016-03-04 ENCOUNTER — Encounter (HOSPITAL_COMMUNITY): Payer: Self-pay | Admitting: *Deleted

## 2016-03-04 ENCOUNTER — Inpatient Hospital Stay (HOSPITAL_COMMUNITY)
Admission: RE | Admit: 2016-03-04 | Discharge: 2016-03-06 | DRG: 743 | Disposition: A | Discharge status: Home / Self Care | Payer: BLUE CROSS/BLUE SHIELD | Source: Ambulatory Visit | Attending: Obstetrics and Gynecology | Admitting: Obstetrics and Gynecology

## 2016-03-04 DIAGNOSIS — Z7982 Long term (current) use of aspirin: Secondary | ICD-10-CM | POA: Diagnosis not present

## 2016-03-04 DIAGNOSIS — Z79899 Other long term (current) drug therapy: Secondary | ICD-10-CM | POA: Diagnosis not present

## 2016-03-04 DIAGNOSIS — G40409 Other generalized epilepsy and epileptic syndromes, not intractable, without status epilepticus: Secondary | ICD-10-CM | POA: Diagnosis present

## 2016-03-04 DIAGNOSIS — N813 Complete uterovaginal prolapse: Principal | ICD-10-CM | POA: Diagnosis present

## 2016-03-04 DIAGNOSIS — Z90710 Acquired absence of both cervix and uterus: Secondary | ICD-10-CM | POA: Diagnosis present

## 2016-03-04 DIAGNOSIS — K660 Peritoneal adhesions (postprocedural) (postinfection): Secondary | ICD-10-CM | POA: Diagnosis present

## 2016-03-04 HISTORY — PX: ABDOMINAL SACROCOLPOPEXY: SHX1114

## 2016-03-04 HISTORY — PX: BILATERAL SALPINGECTOMY: SHX5743

## 2016-03-04 HISTORY — PX: ANTERIOR AND POSTERIOR REPAIR: SHX5121

## 2016-03-04 HISTORY — PX: OOPHORECTOMY: SHX6387

## 2016-03-04 HISTORY — PX: CYSTO: SHX6284

## 2016-03-04 HISTORY — PX: BLADDER SUSPENSION: SHX72

## 2016-03-04 HISTORY — PX: ABDOMINAL HYSTERECTOMY: SHX81

## 2016-03-04 SURGERY — HYSTERECTOMY, ABDOMINAL
Anesthesia: General

## 2016-03-04 MED ORDER — LACTATED RINGERS IV SOLN: INTRAVENOUS | Status: DC

## 2016-03-04 MED ORDER — GLYCOPYRROLATE 0.2 MG/ML IJ SOLN
INTRAMUSCULAR | Status: AC
  Filled 2016-03-04: qty 1

## 2016-03-04 MED ORDER — ESTRADIOL 0.1 MG/GM VA CREA
TOPICAL_CREAM | VAGINAL | Status: AC
  Filled 2016-03-04: qty 42.5

## 2016-03-04 MED ORDER — DIPHENHYDRAMINE HCL 12.5 MG/5ML PO ELIX: 12.5000 mg | ORAL_SOLUTION | Freq: Four times a day (QID) | ORAL | Status: DC | PRN

## 2016-03-04 MED ORDER — LACTATED RINGERS IV SOLN
INTRAVENOUS | Status: DC
  Administered 2016-03-04 – 2016-03-05 (×3): via INTRAVENOUS

## 2016-03-04 MED ORDER — MENTHOL 3 MG MT LOZG: 1.0000 | LOZENGE | OROMUCOSAL | Status: DC | PRN

## 2016-03-04 MED ORDER — PROMETHAZINE HCL 25 MG/ML IJ SOLN
INTRAMUSCULAR | Status: AC
  Filled 2016-03-04: qty 1

## 2016-03-04 MED ORDER — PHENYLEPHRINE 40 MCG/ML (10ML) SYRINGE FOR IV PUSH (FOR BLOOD PRESSURE SUPPORT)
PREFILLED_SYRINGE | INTRAVENOUS | Status: AC
  Filled 2016-03-04: qty 10

## 2016-03-04 MED ORDER — ROCURONIUM BROMIDE 100 MG/10ML IV SOLN
INTRAVENOUS | Status: AC
Start: 2016-03-04 — End: 2016-03-04
  Filled 2016-03-04: qty 1

## 2016-03-04 MED ORDER — DIPHENHYDRAMINE HCL 50 MG/ML IJ SOLN: 12.5000 mg | Freq: Four times a day (QID) | INTRAMUSCULAR | Status: DC | PRN

## 2016-03-04 MED ORDER — LIDOCAINE HCL (CARDIAC) 20 MG/ML IV SOLN
INTRAVENOUS | Status: DC | PRN
  Administered 2016-03-04: 60 mg via INTRAVENOUS

## 2016-03-04 MED ORDER — ESTRADIOL 0.1 MG/GM VA CREA
TOPICAL_CREAM | VAGINAL | Status: DC | PRN
  Administered 2016-03-04: 1 via VAGINAL

## 2016-03-04 MED ORDER — PROPOFOL 10 MG/ML IV BOLUS
INTRAVENOUS | Status: AC
  Filled 2016-03-04: qty 20

## 2016-03-04 MED ORDER — FENTANYL CITRATE (PF) 250 MCG/5ML IJ SOLN
INTRAMUSCULAR | Status: AC
  Filled 2016-03-04: qty 5

## 2016-03-04 MED ORDER — ONDANSETRON HCL 4 MG PO TABS: 4.0000 mg | ORAL_TABLET | Freq: Four times a day (QID) | ORAL | Status: DC | PRN

## 2016-03-04 MED ORDER — LIDOCAINE-EPINEPHRINE 1 %-1:100000 IJ SOLN
INTRAMUSCULAR | Status: AC
  Filled 2016-03-04: qty 1

## 2016-03-04 MED ORDER — DOCUSATE SODIUM 100 MG PO CAPS
100.0000 mg | ORAL_CAPSULE | Freq: Every day | ORAL | Status: DC
  Administered 2016-03-05 – 2016-03-06 (×2): 100 mg via ORAL
  Filled 2016-03-04 (×4): qty 1

## 2016-03-04 MED ORDER — LACTATED RINGERS IV SOLN
INTRAVENOUS | Status: DC
Start: 2016-03-04 — End: 2016-03-04

## 2016-03-04 MED ORDER — KETOROLAC TROMETHAMINE 15 MG/ML IJ SOLN
15.0000 mg | Freq: Four times a day (QID) | INTRAMUSCULAR | Status: AC
  Administered 2016-03-04 – 2016-03-05 (×2): 15 mg via INTRAVENOUS
  Filled 2016-03-04 (×5): qty 1

## 2016-03-04 MED ORDER — ROCURONIUM BROMIDE 100 MG/10ML IV SOLN
INTRAVENOUS | Status: AC
  Filled 2016-03-04: qty 1

## 2016-03-04 MED ORDER — LIDOCAINE HCL (CARDIAC) 20 MG/ML IV SOLN
INTRAVENOUS | Status: AC
  Filled 2016-03-04: qty 5

## 2016-03-04 MED ORDER — IBUPROFEN 600 MG PO TABS
600.0000 mg | ORAL_TABLET | Freq: Four times a day (QID) | ORAL | Status: DC | PRN
  Administered 2016-03-05 – 2016-03-06 (×3): 600 mg via ORAL
  Filled 2016-03-04 (×3): qty 1

## 2016-03-04 MED ORDER — PROMETHAZINE HCL 25 MG/ML IJ SOLN
12.5000 mg | Freq: Once | INTRAMUSCULAR | Status: AC
  Administered 2016-03-04: 12.5 mg via INTRAVENOUS

## 2016-03-04 MED ORDER — PROPOFOL 10 MG/ML IV BOLUS
INTRAVENOUS | Status: DC | PRN
  Administered 2016-03-04: 150 mg via INTRAVENOUS
  Administered 2016-03-04: 20 mg via INTRAVENOUS

## 2016-03-04 MED ORDER — ONDANSETRON HCL 4 MG/2ML IJ SOLN: 4.0000 mg | Freq: Four times a day (QID) | INTRAMUSCULAR | Status: DC | PRN

## 2016-03-04 MED ORDER — DEXAMETHASONE SODIUM PHOSPHATE 10 MG/ML IJ SOLN
INTRAMUSCULAR | Status: DC | PRN
  Administered 2016-03-04: 4 mg via INTRAVENOUS

## 2016-03-04 MED ORDER — SUGAMMADEX SODIUM 200 MG/2ML IV SOLN
INTRAVENOUS | Status: AC
  Filled 2016-03-04: qty 2

## 2016-03-04 MED ORDER — ONDANSETRON HCL 4 MG/2ML IJ SOLN
INTRAMUSCULAR | Status: AC
  Filled 2016-03-04: qty 2

## 2016-03-04 MED ORDER — GLYCOPYRROLATE 0.2 MG/ML IJ SOLN
INTRAMUSCULAR | Status: DC | PRN
  Administered 2016-03-04: 0.2 mg via INTRAVENOUS

## 2016-03-04 MED ORDER — ROCURONIUM BROMIDE 100 MG/10ML IV SOLN
INTRAVENOUS | Status: DC | PRN
  Administered 2016-03-04: 50 mg via INTRAVENOUS
  Administered 2016-03-04: 20 mg via INTRAVENOUS
  Administered 2016-03-04: 30 mg via INTRAVENOUS
  Administered 2016-03-04 (×3): 20 mg via INTRAVENOUS
  Administered 2016-03-04: 10 mg via INTRAVENOUS
  Administered 2016-03-04: 40 mg via INTRAVENOUS
  Administered 2016-03-04 (×4): 20 mg via INTRAVENOUS
  Administered 2016-03-04: 30 mg via INTRAVENOUS

## 2016-03-04 MED ORDER — FENTANYL CITRATE (PF) 100 MCG/2ML IJ SOLN
INTRAMUSCULAR | Status: DC | PRN
  Administered 2016-03-04: 25 ug via INTRAVENOUS
  Administered 2016-03-04: 150 ug via INTRAVENOUS
  Administered 2016-03-04: 25 ug via INTRAVENOUS
  Administered 2016-03-04 (×6): 50 ug via INTRAVENOUS

## 2016-03-04 MED ORDER — SODIUM CHLORIDE 0.9% FLUSH: 9.0000 mL | INTRAVENOUS | Status: DC | PRN

## 2016-03-04 MED ORDER — DEXAMETHASONE SODIUM PHOSPHATE 4 MG/ML IJ SOLN
INTRAMUSCULAR | Status: AC
  Filled 2016-03-04: qty 1

## 2016-03-04 MED ORDER — MORPHINE SULFATE 2 MG/ML IV SOLN
INTRAVENOUS | Status: DC
  Administered 2016-03-04: 16:00:00 via INTRAVENOUS
  Administered 2016-03-04: 1.5 mg via INTRAVENOUS
  Administered 2016-03-05: 6 mg via INTRAVENOUS
  Administered 2016-03-05: 3 mg via INTRAVENOUS
  Administered 2016-03-05: 1.5 mg via INTRAVENOUS
  Filled 2016-03-04: qty 25

## 2016-03-04 MED ORDER — LIDOCAINE-EPINEPHRINE 1 %-1:100000 IJ SOLN
INTRAMUSCULAR | Status: DC | PRN
  Administered 2016-03-04: 11 mL

## 2016-03-04 MED ORDER — OXYCODONE-ACETAMINOPHEN 5-325 MG PO TABS
1.0000 | ORAL_TABLET | ORAL | Status: DC | PRN
  Administered 2016-03-05: 1 via ORAL
  Administered 2016-03-05 – 2016-03-06 (×3): 2 via ORAL
  Filled 2016-03-04 (×3): qty 2
  Filled 2016-03-04: qty 1

## 2016-03-04 MED ORDER — STERILE WATER FOR IRRIGATION IR SOLN
Status: DC | PRN
  Administered 2016-03-04: 200 mL

## 2016-03-04 MED ORDER — KETOROLAC TROMETHAMINE 30 MG/ML IJ SOLN
INTRAMUSCULAR | Status: DC | PRN
  Administered 2016-03-04: 15 mg via INTRAVENOUS

## 2016-03-04 MED ORDER — PHENYLEPHRINE HCL 10 MG/ML IJ SOLN
INTRAMUSCULAR | Status: DC | PRN
  Administered 2016-03-04 (×5): 40 ug via INTRAVENOUS
  Administered 2016-03-04: 80 ug via INTRAVENOUS

## 2016-03-04 MED ORDER — HYDROMORPHONE HCL 1 MG/ML IJ SOLN
INTRAMUSCULAR | Status: AC
  Filled 2016-03-04: qty 1

## 2016-03-04 MED ORDER — CARBAMAZEPINE 200 MG PO TABS
200.0000 mg | ORAL_TABLET | Freq: Three times a day (TID) | ORAL | Status: DC
  Administered 2016-03-04 – 2016-03-06 (×5): 200 mg via ORAL
  Filled 2016-03-04 (×6): qty 1

## 2016-03-04 MED ORDER — HYDROMORPHONE HCL 1 MG/ML IJ SOLN
0.2500 mg | INTRAMUSCULAR | Status: DC | PRN
  Administered 2016-03-04 (×2): 0.5 mg via INTRAVENOUS

## 2016-03-04 MED ORDER — NALOXONE HCL 0.4 MG/ML IJ SOLN: 0.4000 mg | INTRAMUSCULAR | Status: DC | PRN

## 2016-03-04 MED ORDER — ONDANSETRON HCL 4 MG/2ML IJ SOLN
INTRAMUSCULAR | Status: DC | PRN
Start: 2016-03-04 — End: 2016-03-04
  Administered 2016-03-04: 4 mg via INTRAVENOUS

## 2016-03-04 MED ORDER — MIDAZOLAM HCL 2 MG/2ML IJ SOLN
INTRAMUSCULAR | Status: AC
  Filled 2016-03-04: qty 2

## 2016-03-04 MED ORDER — BUPIVACAINE HCL (PF) 0.25 % IJ SOLN
INTRAMUSCULAR | Status: AC
  Filled 2016-03-04: qty 30

## 2016-03-04 MED ORDER — MIDAZOLAM HCL 2 MG/2ML IJ SOLN
INTRAMUSCULAR | Status: DC | PRN
  Administered 2016-03-04: 2 mg via INTRAVENOUS

## 2016-03-04 MED ORDER — KETOROLAC TROMETHAMINE 30 MG/ML IJ SOLN
INTRAMUSCULAR | Status: AC
  Filled 2016-03-04: qty 1

## 2016-03-04 MED ORDER — LACTATED RINGERS IV SOLN
INTRAVENOUS | Status: DC
  Administered 2016-03-04 (×4): via INTRAVENOUS

## 2016-03-04 MED ORDER — 0.9 % SODIUM CHLORIDE (POUR BTL) OPTIME
TOPICAL | Status: DC | PRN
  Administered 2016-03-04: 1000 mL

## 2016-03-04 MED ORDER — SUGAMMADEX SODIUM 200 MG/2ML IV SOLN
INTRAVENOUS | Status: DC | PRN
  Administered 2016-03-04: 160 mg via INTRAVENOUS

## 2016-03-04 SURGICAL SUPPLY — 79 items
BLADE SURG 11 STRL SS (BLADE) ×4 IMPLANT
BLADE SURG 15 STRL LF C SS BP (BLADE) ×3 IMPLANT
BLADE SURG 15 STRL SS (BLADE) ×1
CANISTER SUCT 3000ML (MISCELLANEOUS) ×4 IMPLANT
CATH FOLEY 2WAY SLVR  5CC 18FR (CATHETERS) ×1
CATH FOLEY 2WAY SLVR 5CC 18FR (CATHETERS) ×3 IMPLANT
CATH FOLEY 3WAY  5CC 16FR (CATHETERS)
CATH FOLEY 3WAY 30CC 18FR (CATHETERS) IMPLANT
CATH FOLEY 3WAY 5CC 16FR (CATHETERS) IMPLANT
CELLS DAT CNTRL 66122 CELL SVR (MISCELLANEOUS) IMPLANT
CLOTH BEACON ORANGE TIMEOUT ST (SAFETY) ×4 IMPLANT
CONT PATH 16OZ SNAP LID 3702 (MISCELLANEOUS) IMPLANT
COUNTER NEEDLE 1200 MAGNETIC (NEEDLE) ×4 IMPLANT
DECANTER SPIKE VIAL GLASS SM (MISCELLANEOUS) ×4 IMPLANT
DEVICE CAPIO SLIM SINGLE (INSTRUMENTS) IMPLANT
DISSECTOR SPONGE CHERRY (GAUZE/BANDAGES/DRESSINGS) ×4 IMPLANT
DRAPE UNDERBUTTOCKS STRL (DRAPE) ×4 IMPLANT
DRAPE WARM FLUID 44X44 (DRAPE) ×4 IMPLANT
DRSG OPSITE POSTOP 4X10 (GAUZE/BANDAGES/DRESSINGS) ×4 IMPLANT
DURAPREP 26ML APPLICATOR (WOUND CARE) ×4 IMPLANT
ELECT BLADE 6 FLAT ULTRCLN (ELECTRODE) ×4 IMPLANT
ETHIBOND 2 0 GREEN CT 2 30IN (SUTURE) ×20 IMPLANT
FILTER STRAW FLUID ASPIR (MISCELLANEOUS) IMPLANT
GAUZE PACKING 2X5 YD STRL (GAUZE/BANDAGES/DRESSINGS) IMPLANT
GAUZE SPONGE 4X4 16PLY XRAY LF (GAUZE/BANDAGES/DRESSINGS) ×4 IMPLANT
GLOVE BIO SURGEON STRL SZ 6.5 (GLOVE) ×4 IMPLANT
GLOVE BIOGEL PI IND STRL 6.5 (GLOVE) ×6 IMPLANT
GLOVE BIOGEL PI IND STRL 7.0 (GLOVE) ×9 IMPLANT
GLOVE BIOGEL PI INDICATOR 6.5 (GLOVE) ×2
GLOVE BIOGEL PI INDICATOR 7.0 (GLOVE) ×3
GOWN STRL REUS W/TWL LRG LVL3 (GOWN DISPOSABLE) ×16 IMPLANT
LIQUID BAND (GAUZE/BANDAGES/DRESSINGS) ×4 IMPLANT
MESH RESTORELLE Y CONTOUR (Mesh General) ×4 IMPLANT
NEEDLE HYPO 22GX1.5 SAFETY (NEEDLE) ×8 IMPLANT
NEEDLE MAYO 6 CRC TAPER PT (NEEDLE) IMPLANT
NS IRRIG 1000ML POUR BTL (IV SOLUTION) ×4 IMPLANT
PACK ABDOMINAL GYN (CUSTOM PROCEDURE TRAY) ×4 IMPLANT
PACK VAGINAL WOMENS (CUSTOM PROCEDURE TRAY) IMPLANT
PAD MAGNETIC INST (MISCELLANEOUS) ×4 IMPLANT
PAD OB MATERNITY 4.3X12.25 (PERSONAL CARE ITEMS) ×4 IMPLANT
PLUG CATH AND CAP STER (CATHETERS) IMPLANT
PROTECTOR NERVE ULNAR (MISCELLANEOUS) IMPLANT
RETRACTOR WND ALEXIS 25 LRG (MISCELLANEOUS) ×3 IMPLANT
RTRCTR WOUND ALEXIS 18CM MED (MISCELLANEOUS)
RTRCTR WOUND ALEXIS 25CM LRG (MISCELLANEOUS) ×4
SET CYSTO W/LG BORE CLAMP LF (SET/KITS/TRAYS/PACK) ×4 IMPLANT
SHEET LAVH (DRAPES) ×4 IMPLANT
SLING TVT EXACT (Sling) ×4 IMPLANT
SPONGE GAUZE 4X4 12PLY STER LF (GAUZE/BANDAGES/DRESSINGS) IMPLANT
SPONGE LAP 18X18 X RAY DECT (DISPOSABLE) ×8 IMPLANT
SPONGE LAP 4X18 X RAY DECT (DISPOSABLE) IMPLANT
SPONGE SURGIFOAM ABS GEL 12-7 (HEMOSTASIS) IMPLANT
STAPLER VISISTAT 35W (STAPLE) IMPLANT
SURGIFLO TRUKIT (HEMOSTASIS) ×4 IMPLANT
SUT CAPIO ETHIBPND (SUTURE) IMPLANT
SUT ETHIBOND 0 (SUTURE) ×36 IMPLANT
SUT PDS AB 0 CT1 27 (SUTURE) IMPLANT
SUT PDS AB 2-0 CT1 27 (SUTURE) IMPLANT
SUT PLAIN 2 0 XLH (SUTURE) IMPLANT
SUT VIC AB 0 CT1 18XCR BRD8 (SUTURE) ×6 IMPLANT
SUT VIC AB 0 CT1 27 (SUTURE) ×3
SUT VIC AB 0 CT1 27XBRD ANBCTR (SUTURE) ×9 IMPLANT
SUT VIC AB 0 CT1 8-18 (SUTURE) ×2
SUT VIC AB 2-0 CT1 (SUTURE) IMPLANT
SUT VIC AB 2-0 CT1 27 (SUTURE) ×6
SUT VIC AB 2-0 CT1 TAPERPNT 27 (SUTURE) ×18 IMPLANT
SUT VIC AB 2-0 CT2 27 (SUTURE) IMPLANT
SUT VIC AB 2-0 SH 27 (SUTURE) ×5
SUT VIC AB 2-0 SH 27XBRD (SUTURE) ×15 IMPLANT
SUT VIC AB 2-0 UR6 27 (SUTURE) IMPLANT
SUT VIC AB 4-0 PS2 27 (SUTURE) ×4 IMPLANT
SUT VICRYL 0 TIES 12 18 (SUTURE) ×4 IMPLANT
SYR 30ML LL (SYRINGE) ×4 IMPLANT
SYR 50ML LL SCALE MARK (SYRINGE) IMPLANT
SYR CONTROL 10ML LL (SYRINGE) ×4 IMPLANT
TOWEL OR 17X24 6PK STRL BLUE (TOWEL DISPOSABLE) ×8 IMPLANT
TRAY FOLEY BAG SILVER LF 16FR (SET/KITS/TRAYS/PACK) ×4 IMPLANT
TUBING NON-CON 1/4 X 20 CONN (TUBING) ×4 IMPLANT
WATER STERILE IRR 1000ML POUR (IV SOLUTION) IMPLANT

## 2016-03-04 NOTE — Progress Notes (Signed)
Day of Surgery Procedure(s) (LRB): HYSTERECTOMY ABDOMINAL (N/A) ABDOMINO SACROCOLPOPEXY with HALBAN'S CULDOPLASTY (N/A) ANTERIOR (CYSTOCELE) AND POSTERIOR REPAIR (RECTOCELE) (N/A) TRANSVAGINAL TAPE (TVT) PROCEDURE exact midurethral sling (N/A) CYSTO (N/A) BILATERAL SALPINGECTOMY RIGHT OOPHORECTOMY  Subjective: Patient reports generalized pain.  No nausea.   Objective: I have reviewed patient's vital signs and intake and output. T max 985. , T now 98.5, BP 123/74, P 76, RR 16. I/O - 3800 cc IV/ 650 cc UO  General: sleepy but appropriate conversation. Resp: clear to auscultation bilaterally Cardio: regular rate and rhythm, S1, S2 normal, no murmur, click, rub or gallop GI: soft, non-tender; bowel sounds normal; no masses,  no organomegaly and incision: clean, dry and intact Extremities:  PAS and Ted hose on. Vaginal Bleeding: minimal  Assessment: s/p Procedure(s) with comments: HYSTERECTOMY ABDOMINAL (N/A) - ANESTHESIA CONSULT NEEDED ABDOMINO SACROCOLPOPEXY with HALBAN'S CULDOPLASTY (N/A) ANTERIOR (CYSTOCELE) AND POSTERIOR REPAIR (RECTOCELE) (N/A) TRANSVAGINAL TAPE (TVT) PROCEDURE exact midurethral sling (N/A) CYSTO (N/A) BILATERAL SALPINGECTOMY RIGHT OOPHORECTOMY: stable  Plan: Foley overnight.  Morphine PCA and Toradol.  CBC and BMP in the am.  Briefly discussed op findings and procedure.  Husband present at the end of my post op visit.    LOS: 0 days    Arloa Koh 03/04/2016, 4:16 PM

## 2016-03-04 NOTE — Anesthesia Procedure Notes (Addendum)
Procedure Name: Intubation Date/Time: 03/04/2016 7:35 AM Performed by: Raenette Rover Pre-anesthesia Checklist: Patient identified, Emergency Drugs available, Suction available and Patient being monitored Patient Re-evaluated:Patient Re-evaluated prior to inductionOxygen Delivery Method: Circle system utilized Preoxygenation: Pre-oxygenation with 100% oxygen Intubation Type: IV induction Ventilation: Mask ventilation without difficulty, Two handed mask ventilation required and Oral airway inserted - appropriate to patient size Laryngoscope Size: Mac and 3 Grade View: Grade II Tube type: Oral Tube size: 7.0 mm Number of attempts: 2 (1 by CRNA with MAC3--Grade 4 view; 1 by MDA with MAC3--Grade 2 view.) Airway Equipment and Method: Stylet Placement Confirmation: ETT inserted through vocal cords under direct vision,  positive ETCO2,  CO2 detector and breath sounds checked- equal and bilateral Secured at: 21 cm Tube secured with: Tape Dental Injury: Teeth and Oropharynx as per pre-operative assessment  Difficulty Due To: Difficult Airway- due to anterior larynx

## 2016-03-04 NOTE — Transfer of Care (Signed)
Immediate Anesthesia Transfer of Care Note  Patient: Tina Peck  Procedure(s) Performed: Procedure(s) with comments: HYSTERECTOMY ABDOMINAL (N/A) - ANESTHESIA CONSULT NEEDED ABDOMINO SACROCOLPOPEXY with HALBAN'S CULDOPLASTY (N/A) ANTERIOR (CYSTOCELE) AND POSTERIOR REPAIR (RECTOCELE) (N/A) TRANSVAGINAL TAPE (TVT) PROCEDURE exact midurethral sling (N/A) CYSTO (N/A) BILATERAL SALPINGECTOMY RIGHT OOPHORECTOMY  Patient Location: PACU  Anesthesia Type:General  Level of Consciousness: awake, alert , oriented and patient cooperative  Airway & Oxygen Therapy: Patient Spontanous Breathing and Patient connected to nasal cannula oxygen  Post-op Assessment: Report given to RN and Post -op Vital signs reviewed and stable  Post vital signs: Reviewed and stable  Last Vitals:  Filed Vitals:   03/04/16 0621  BP: 129/78  Pulse: 69  Temp: 36.6 C  Resp: 18    Last Pain:  Filed Vitals:   03/04/16 0627  PainSc: 1       Patients Stated Pain Goal: 3 ([id number])  Complications: No apparent anesthesia complications

## 2016-03-04 NOTE — H&P (Signed)
Progress Notes      Nunzio Cobbs, MD at 02/11/2016 12:44 PM     Status: Signed       Expand All Collapse All   Patient ID: Tina Peck, female DOB: 12-Aug-1956, 60 y.o. MRN: [id number] GYNECOLOGY VISIT  HPI: 60 y.o. Married Caucasian female  G6P6 with Patient's last menstrual period was 09/08/2006.  here for surgical consult.   Scheduled for surgery for complete uterovaginal prolapse.  Pessary is not satisfactory.  Denies GSI.  Does manipulation to have BMs.   Had pelvic ultrasound for abdominal bloating and this showed a normal uterus and right ovary.  Hx LSO for dermoid.   Patient is taking her antiseizure medication only twice a day and not three times a day.  She will start 3 times a day starting today.  Has never had another seizure.   Never started the vaginal estrogen cream due to cost.  GYNECOLOGIC HISTORY: Patient's last menstrual period was 09/08/2006. Contraception: Postmenopausal Menopausal hormone therapy: none Last mammogram: 11-21-15 Density B/Neg/BiRads1:[facility] Last pap smear: 11-08-12 Neg:Neg HR HPV   OB History    Gravida Para Term Preterm AB TAB SAB Ectopic Multiple Living   6 6               Patient Active Problem List   Diagnosis Date Noted  . 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 Prolapse of uterus 12/29/2012  . Cystocele 12/29/2012  . Seizure [facility])     Past Medical History  Diagnosis Date  . Seizure [facility]) 2007    frontal lobe, none since 2007    Past Surgical History  Procedure Laterality Date  . Tonsillectomy      as a child  . Oophorectomy Left 1986    LSO-Dermoid    Current Outpatient Prescriptions  Medication Sig Dispense Refill  . aspirin 81 MG tablet Take 81 mg by mouth daily.    . cholecalciferol (VITAMIN D) 1000 UNITS tablet Take 5,000 Units by mouth 2 (two) times  daily.    . cyanocobalamin 1000 MCG tablet Take 100 mcg by mouth.    . EPITOL 200 MG tablet Take 1 tablet by mouth 3 (three) times daily.     No current facility-administered medications for this visit.     ALLERGIES: Review of patient's allergies indicates no known allergies.  Family History  Problem Relation Age of Onset  . Cancer Maternal Grandfather   . Osteoarthritis Mother   . Spina bifida Brother 0    lived 4 months  . Glaucoma Father 85  . Diabetes Paternal Grandmother     Social History   Social History  . Marital Status: Married    Spouse Name: N/A  . Number of Children: 6  . Years of Education: 12th   Occupational History  . homemaker    Social History Main Topics  . Smoking status: Never Smoker   . Smokeless tobacco: Never Used  . Alcohol Use: No     Comment: drinks coffee and tea daily  . Drug Use: No  . Sexual Activity:    Partners: Male    Birth Control/ Protection: Post-menopausal   Other Topics Concern  . Not on file   Social History Narrative   Patient lives at home with her husband and has 6 children. 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     ROS: Pertinent items are noted in HPI.  PHYSICAL EXAMINATION:   BP 108/64 mmHg  Pulse 70  Ht 5\' 9"  (1.753 m)  Wt 174 lb (78.926 kg)  BMI 25.68 kg/m2  LMP 09/08/2006  General appearance: alert, cooperative and appears stated age Head: Normocephalic, without obvious abnormality, atraumatic Neck: no adenopathy, supple, symmetrical, trachea midline and thyroid normal to inspection and palpation Lungs: clear to auscultation bilaterally  Heart: regular rate and rhythm Abdomen: incision(s):Yes. , ____Pfannenstiel incision________ soft, non-tender, no masses, no organomegaly Extremities: extremities normal, atraumatic, no cyanosis or edema Skin: Skin color, texture,  turgor normal. 2.5 cm vesicular and erythematous area of left abdominal wall.  Lymph nodes: Cervical, supraclavicular, and axillary nodes normal. No abnormal inguinal nodes palpated Neurologic: Grossly normal  Pelvic: External genitalia: no lesions  Urethra: normal appearing urethra with no masses, tenderness or lesions  Bartholins and Skenes: normal   Vagina: normal appearing vagina with normal color and discharge, no lesions. Third degree cystocele, second degree uterine prolapse, and second degree rectocele.  Cervix: no lesions   Bimanual Exam: Uterus: normal size, contour, position, consistency, mobility, non-tender  Adnexa: normal adnexa and no mass, fullness, tenderness  Rectal exam: Yes. . Confirms.  Anus: normal sphincter tone, no lesions  Pessary ring with support removed, cleansed, and replaced.   Chaperone was present for exam.  ASSESSMENT  Complete uterovaginal prolapse.  Status post LSO for dermoid.  Seizure disorder.  PLAN  Discussion of dermoid cysts as patient had questions about them.  Proceed with total abdominal hysterectomy with right salpingo-oophorectomy, abdominal sacrocolpopexy, Halban's culdoplasty, anterior and posterior colporrhaphy, TVT Exact midurethral sling, and cystoscopy. Risks, benefits, and alternative discussed with the patient who wishes to proceed.  Patient understands that permanent mesh and permanent suture is going to be used for her prolapse and incontinence surgery.  Surgical expectations and outcome discussed.  She indicates understanding of her procedure. She will take an enema the night before surgery.  Decreased activity and no lifting over 10 pounds and no sexual activity for 3 months following the procedure. She will increase her Epitol to tid. We discussed the importance of reducing seizure risk.  An After Visit  Summary was printed and given to the patient.  __25____ minutes face to face time of which over 50% was spent in counseling.

## 2016-03-04 NOTE — Op Note (Addendum)
OPERATIVE REPORT   PREOPERATIVE DIAGNOSIS: Complete uterovaginal prolapse.  POSTOPERATIVE DIAGNOSIS: Complete uterovaginal prolapse.  PROCEDURE: Total abdominal hysterectomy with bilateral salpingectomy, right oophorectomy, abdominal sacral colpopexy, Halban's culdoplasty, lysis of omental adhesions,  TVT Exact midurethral sling and cystoscopy, anterior and posterior colporrhaphy.  SURGEON: Lenard Galloway, MD  ASSISTANT:  Edwinna Areola, MD  ANESTHESIA: General endotracheal.  IV FLUIDS: 3000 cc LR  EBL:  150 cc  URINE OUTPUT: [id number] cc  COMPLICATIONS: None.  INDICATIONS FOR THE PROCEDURE: The patient is a 60 year old   para 41, Caucasian female, who presents with progressive uterovaginal prolapse and a request for surgical care. The patient has been noted to develop worsening uterovaginal prolapse and at her last examination was noted to have complete prolapse of the cervix through the introitus. The plan is now made to proceed with a total abdominal hysterectomy with a bilateral salpingectomy, right oophorectomy, abdominal sacral colpopexy, Halban's culdoplasty, TVT Exact mid urethral sling, and possible anterior and posterior colporrhaphy. Risks, benefits, and alternatives have been reviewed with the patient who wishes to proceed.  FINDINGS: Examination under anesthesia revealed a third-degree cystocele and second degree uterine prolapse and a second degree rectocele.  At the time of laparotomy, the patient was noted to have a normal uterus, bilateral tubes and right ovary. The left ovary was absent. There  was evidence of omental adhesive disease in the abdomen.The liver edge and gallbladder palpated to be normal. The kidneys were unremarkable as was the periaortic region.  Cystoscopy at termination of the sling portion of the procedure documented the bladder to be intact throughout 360 degrees including the bladder dome and trigone.  There was no evidence of a foreign body in the bladder or the urethra. The ureters were patent bilaterally.  SPECIMENS: The uterus, cervix, bilateral tubes and right ovary were sent to Pathology.  PROCEDURE IN DETAIL: The patient was reidentified in the preop hold area. She did receive cefotetan 2 g IV for antibiotic prophylaxis. She received both TED hose and PAS stockings for DVT prophylaxis.  The patient was escorted to the operating room, where she was placed in the dorsal lithotomy position in [place]. General endotracheal anesthesia was induced. The patient's lower abdomen, vagina, and perineum were sterilely prepped and she was sterilely draped. The Foley catheter was placed inside the bladder and left to gravity drainage throughout and at the termination of the procedure.  The procedure began by creating a Pfannenstiel incision with a scalpel. The incision was carried down to the subcutaneous tissues using monopolar cautery for hemostasis. The fascia was incised in the midline with a scalpel and the incision was extended bilaterally using the Mayo scissors. The rectus muscles were bluntly divided in the midline. The parietal peritoneum was elevated with 2 hemostat clamps and entered sharply with the Metzenbaum scissors. The peritoneal incision was extended cranially and caudally.  Omental adhesions were lysed along the  abdominal wall. An Alexis retractor was then placed inside the peritoneal cavity  and the patient was placed in the Trendelenburg position. The bowel was packed into the upper abdomen with moistened lap pads.  The procedure began by placing Kelly clamps across the adnexal structures bilaterally. The left round ligament was isolated and was suture ligated with a transfixing suture of 0 Vicryl. Monopolar cautery was used to bisect the round ligament and the anterior and posterior leaves were opened with monopolar cautery. The bladder  flap was taken down anteriorly using a Metzenbaum scissors. The ureter was identified along the  patient's left pelvic sidewall. The mesosalpinx was clamped with  a Kelly clamp, divided, and then ligated with 2/0 vicryl.  This was repeated until the proximal tube was reached.  Monopolar cautery was then used  to bisect the tube proximally.  The specimen was set aside and then  sent with the specimen to pathology.  The same procedure that was performed on the patient's left hand side was now repeated on the patient's right hand side for isolation of the round ligament and dissection in the broad ligament.  The right infundibulopelvic  ligament was identified. Again, the ureter was noted prior to clamping the  infundibulopelvic ligament.  The bladder flap was further taken down sharply with the Metzenbaum scissors. Each of the uterine arteries were skeletonized and they were then clamped, sharply divided, and suture ligated with 0 Vicryl suture. The bladder was further dissected off the cervix anteriorly and the inferior aspects of the cardinal ligaments were clamped with straight Heaney clamps, sharply divided, and suture ligated with 0 Vicryl bilaterally. Eventually, the uterosacral ligaments were reached. A clamp was placed across the vaginal apices, and the pedicles were  divided.  The vagina was entered with these pedicles. The pedicle were suture ligated with a transfixing suture of 0 Vicryl.The Mayo scissors was  used to sharply excise the cervix and the specimen from the vaginal apex.  The specimen was sent to Pathology.  The vaginal cuff was closed at this time with figure-of-eight sutures of 0 Vicryl.  The pelvis was irrigated and suctioned and hemostasis was noted to be good.  The abdominal sacrocolpopexy was performed next. An EEA Sizer was placed in the vagina at this time and the peritoneum along the anterior and posterior vaginal walls was opened and  dissection performed down to the level of the underside of the vagina, both anteriorly and posteriorly.  Attention at this time was turned to the sacral promontory. The peritoneum overlying the sacral promontory was identified along with the landmarks of the bifurcation of the aorta and iliac vessels and the right ureter. The peritoneum was opened using a combination of a Metzenbaum scissors and monopolar cautery. Careful dissection was performed down to the level of the sacral promontory. The anterior longitudinal ligament was identified along with the middle sacral vessels. Hemostasis was good. The peritoneum was then opened inferiorly along the patient's right pelvic sidewall and all the way down to the level of dissection along the posterior vaginal peritoneal Incision.  Halban's culdoplasty sutures of 0/0 Ethibond were placed in the posterior cul de sac.  The patient had a very deep pelvis.  At this time, the Colpoplast mesh was trimmed to properly fit the patient's anatomy and the mesh was secured to the anterior vagina using a series of several 0 Ethibond sutures. The same was performed along the posterior vaginal wall.  The EEA Sizer was removed and a hand was placed inside the vagina and the elevation of the vaginal cuff was simulated. The point of attachment of the colpoplast mesh was then mapped to the sacral promontory and 2 sutures of 0 Ethibond were brought through the anterior longitudinal ligament away from the middle sacral vessels. These  sutures were then brought through the mesh. The sutures were tied. Excess mesh was trimmed at this time. The mesh was re- peritonealized by closing the retroperitoneal space using 2-0 Vicryl suture. 2-0 Vicryl suture was also used to close the peritoneum over the vaginal cuff extending from right round ligament to the left  round ligament. This allowed 100% coverage of the mesh material.  The pelvis was once  again irrigated and suctioned. Hemostasis was good. The abdomen was closed at this time. The moistened lap pads and the Alexis retractor were removed from the peritoneal cavity. The parietal peritoneum was closed with a running suture of 2-0 Vicryl. The fascia was  closed with a running suture of 0 Vicryl. The subcutaneous layer was irrigated  and suctioned and made hemostatic with monopolar cautery. Interrupted sutures of 2-0 plain were placed in the subcutaneous layer and the skin was closed with a subcuticular suture of 4-0 Vicryl. Dermabond was placed over the incision.   Attention was turned to the vaginal portion of the procedure at this point. The patient was examined and there was excellent vaginal cuff support.   Allis clamps were used to mark a 4 cm area underneath the beginning 1 cm below the urethral meatus and extending down toward the vaginal cuff. The mucosa was injected locally with 1% lidocaine with epinephrine 1:100,000. The mucosa was incised vertically in the midline with a scalpel and a combination of sharp and blunt dissection were used to dissect this vaginal tissue and mucosa off the underlying bladder. The dissection was carried back to the pubic rami.  A 1 cm suprapubic incisions were then created 2 cm to the right and left in the midline. The Foley catheter was removed. The TVT Exact was performed in a bottom up fashion. The urethral guide and Foley tip were placed in the urethra and the urethra was deflected properly as the TVT guide was brought through the right retropubic space and up through the right suprapubic incision. The urethra was then deflected in the opposite direction and the TVT guide was placed through the left retropubic space and through the left suprapubic ipsilateral incision.  The obturator guide was removed at this time and cystoscopy was performed and the findings were as noted above.  The bladder was drained of all  cystoscopic fluid and the Foley catheter was replaced. The sling was brought up through the suprapubic incisions bilaterally. The plastic sheaths were removed as a Kelly clamp was placed between the urethra and the sling. The sling was noted to be in excellent position. A small anterior colporrhaphy was performed with  vertical mattress sutures of 0/0 Vicryl.  Excess mesh was trimmed  suprapubically. Surgiflow was placed in over the bladder dissection,  and hemostasis was good. The vaginal skin edges were trimmed at this  time and the anterior vaginal wall was closed with a running lock suture of 2-0 Vicryl.  The suprapubic incisions were closed at the termination of the procedure with Dermabond.  Attention was turned to the posterior vaginal wall at this time. Allis clamps were used to mark the introitus, the perineal body, and then the posterior vaginal wall mucosa. The perineal body and the posterior vaginal mucosa were injected with 1% lidocaine with epinephrine 1:100,000. A triangular wedge of epithelium was excised from the perineal body and the posterior vaginal mucosa was incised vertically in the midline with the Metzenbaum scissors. Sharp and blunt dissection were used to dissect the perirectal fascia off the vaginal mucosa bilaterally. The posterior colporrhaphy was then performed with vertical mattress sutures of 0 Vicryl which reduced the rectocele. Crown stitches of 0 Vicryl were placed along the perineal body. Rectal exam confirmed the absence of sutures in the rectum. Excess vaginal mucosa was trimmed and the posterior vaginal mucosa was closed with a running lock  suture of 2-0 Vicryl. This was brought down to the hymenal ring and then under the hymen. The transverse perineal muscles were reapproximated with a running suture of this 2-0 Vicryl. The suture was then brought up the perineal body in a subcuticular fashion as for an episiotomy repair. The  knot was tied at the hymen.  Vaginal examination confirmed excellent vaginal support and elevation. Final rectal exam documented the absence of sutures in the rectum. A gauze packing with Estrace cream was placed inside the vagina.  At this point, the patient was awakened and extubated, and escorted to the recovery room in stable condition. There were no complications. All needle, instrument, and sponge counts were correct.     Lenard Galloway, M.D.

## 2016-03-04 NOTE — Brief Op Note (Signed)
03/04/2016  12:55 PM  PATIENT:  Tina Peck  60 y.o. female  PRE-OPERATIVE DIAGNOSIS:  complete uterovaginal prolapse  POST-OPERATIVE DIAGNOSIS:  complete uterovaginal prolapse  PROCEDURE:  Procedure(s) with comments: HYSTERECTOMY ABDOMINAL (N/A) - ANESTHESIA CONSULT NEEDED ABDOMINO SACROCOLPOPEXY with HALBAN'S CULDOPLASTY (N/A) ANTERIOR (CYSTOCELE) AND POSTERIOR REPAIR (RECTOCELE) (N/A) TRANSVAGINAL TAPE (TVT) PROCEDURE exact midurethral sling (N/A) CYSTO (N/A) BILATERAL SALPINGECTOMY RIGHT OOPHORECTOMY  SURGEON:  Surgeon(s) and Role:    * Brook E Yisroel Ramming, MD - Primary    * Megan Salon, MD - Assisting  PHYSICIAN ASSISTANT:   NA  ASSISTANTS: Lyman Speller, MD   ANESTHESIA:   general, local with 1% lidocaine with epinephrine 1:100,000  EBL:  Total I/O In: 3000 [I.V.:3000] Out: 550 [Urine:400; Blood:150]  BLOOD ADMINISTERED:none  DRAINS: Urinary Catheter (Foley)   LOCAL MEDICATIONS USED:  LIDOCAINE   SPECIMEN:  Source of Specimen:  Uterus, cervix bilateral tubes and left ovary.  DISPOSITION OF SPECIMEN:  PATHOLOGY  COUNTS:  YES  TOURNIQUET:  * No tourniquets in log *  DICTATION: .Note written in EPIC  PLAN OF CARE: Admit to inpatient   PATIENT DISPOSITION:  PACU - hemodynamically stable.   Delay start of Pharmacological VTE agent (>24hrs) due to surgical blood loss or risk of bleeding: not applicable

## 2016-03-04 NOTE — Progress Notes (Signed)
Update to History and Physical  Hx LSO for dermoid in past.   No marked change in status.  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  EKG compared to past and reviewed with anesthesia.  No change noted.   OK to proceed with surgery today. Will have RSO at time of surgery, not BSO.

## 2016-03-04 NOTE — Anesthesia Postprocedure Evaluation (Signed)
Anesthesia Post Note  Patient: Antionetta Humenik  Procedure(s) Performed: Procedure(s) (LRB): HYSTERECTOMY ABDOMINAL (N/A) ABDOMINO SACROCOLPOPEXY with HALBAN'S CULDOPLASTY (N/A) ANTERIOR (CYSTOCELE) AND POSTERIOR REPAIR (RECTOCELE) (N/A) TRANSVAGINAL TAPE (TVT) PROCEDURE exact midurethral sling (N/A) CYSTO (N/A) BILATERAL SALPINGECTOMY RIGHT OOPHORECTOMY  Patient location during evaluation: PACU Anesthesia Type: General Level of consciousness: awake Pain management: pain level controlled Vital Signs Assessment: post-procedure vital signs reviewed and stable Respiratory status: spontaneous breathing Cardiovascular status: stable Postop Assessment: no signs of nausea or vomiting Anesthetic complications: no     Last Vitals:  Filed Vitals:   03/04/16 1300 03/04/16 1315  BP: 118/76 128/78  Pulse: 84 76  Temp:    Resp: 18 12    Last Pain:  Filed Vitals:   03/04/16 1335  PainSc: 3    Pain Goal: Patients Stated Pain Goal: 3 (03/04/16 1315)               [place]

## 2016-03-05 ENCOUNTER — Encounter (HOSPITAL_COMMUNITY): Payer: Self-pay | Admitting: Obstetrics and Gynecology

## 2016-03-05 LAB — BASIC METABOLIC PANEL
ANION GAP: 6 (ref 5–15)
BUN: 9 mg/dL (ref 6–20)
CALCIUM: 7.7 mg/dL — AB (ref 8.9–10.3)
CO2: 25 mmol/L (ref 22–32)
CREATININE: 0.43 mg/dL — AB (ref 0.44–1.00)
Chloride: 105 mmol/L (ref 101–111)
GLUCOSE: 117 mg/dL — AB (ref 65–99)
Potassium: 3.8 mmol/L (ref 3.5–5.1)
Sodium: 136 mmol/L (ref 135–145)

## 2016-03-05 LAB — CBC
HEMATOCRIT: 34.3 % — AB (ref 36.0–46.0)
Hemoglobin: 11.7 g/dL — ABNORMAL LOW (ref 12.0–15.0)
MCH: 30.2 pg (ref 26.0–34.0)
MCHC: 34.1 g/dL (ref 30.0–36.0)
MCV: 88.6 fL (ref 78.0–100.0)
PLATELETS: 266 10*3/uL (ref 150–400)
RBC: 3.87 MIL/uL (ref 3.87–5.11)
RDW: 13.2 % (ref 11.5–15.5)
WBC: 14.1 10*3/uL — AB (ref 4.0–10.5)

## 2016-03-05 NOTE — Anesthesia Postprocedure Evaluation (Signed)
Anesthesia Post Note  Patient: Tina Peck  Procedure(s) Performed: Procedure(s) (LRB): HYSTERECTOMY ABDOMINAL (N/A) ABDOMINO SACROCOLPOPEXY with HALBAN'S CULDOPLASTY (N/A) ANTERIOR (CYSTOCELE) AND POSTERIOR REPAIR (RECTOCELE) (N/A) TRANSVAGINAL TAPE (TVT) PROCEDURE exact midurethral sling (N/A) CYSTO (N/A) BILATERAL SALPINGECTOMY RIGHT OOPHORECTOMY  Patient location during evaluation: [facility] Anesthesia Type: General Level of consciousness: awake and alert Pain management: pain level controlled Vital Signs Assessment: post-procedure vital signs reviewed and stable Respiratory status: spontaneous breathing, nonlabored ventilation and respiratory function stable Cardiovascular status: blood pressure returned to baseline and stable Postop Assessment: no signs of nausea or vomiting Anesthetic complications: no     Last Vitals:  Filed Vitals:   03/05/16 0108 03/05/16 0521  BP: 105/60 113/65  Pulse: 72 74  Temp: 36.7 C 37 C  Resp: 15 19    Last Pain:  Filed Vitals:   03/05/16 0622  PainSc: Asleep   Pain Goal: Patients Stated Pain Goal: 4 (03/05/16 0520)               Riki Sheer

## 2016-03-05 NOTE — Progress Notes (Signed)
1 Day Post-Op Procedure(s) (LRB): HYSTERECTOMY ABDOMINAL (N/A) ABDOMINO SACROCOLPOPEXY with HALBAN'S CULDOPLASTY (N/A) ANTERIOR (CYSTOCELE) AND POSTERIOR REPAIR (RECTOCELE) (N/A) TRANSVAGINAL TAPE (TVT) PROCEDURE exact midurethral sling (N/A) CYSTO (N/A) BILATERAL SALPINGECTOMY RIGHT OOPHORECTOMY  Subjective: Patient reports was sleepy last hs.  Up once out of bed.  Foley out and no void yet.   Objective: I have reviewed patient's vital signs, intake and output and labs. T max 98.6 and T now 98.6, BP 113/65,  P 74, RR 19. I/O - 5404/2950 cc Hgb 11.7, WBC 14.1  General: alert and cooperative Resp: clear to auscultation bilaterally Cardio: regular rate and rhythm, S1, S2 normal, no murmur, click, rub or gallop GI: incision: clean, dry and intact and hypoactive but present bowel sounds.  Some bruising along the incisions.  Soft and nontender abdomen.  Vaginal Bleeding: minimal  Assessment: s/p Procedure(s) with comments: HYSTERECTOMY ABDOMINAL (N/A) - ANESTHESIA CONSULT NEEDED ABDOMINO SACROCOLPOPEXY with HALBAN'S CULDOPLASTY (N/A) ANTERIOR (CYSTOCELE) AND POSTERIOR REPAIR (RECTOCELE) (N/A) TRANSVAGINAL TAPE (TVT) PROCEDURE exact midurethral sling (N/A) CYSTO (N/A) BILATERAL SALPINGECTOMY RIGHT OOPHORECTOMY: stable  Plan: Advance diet Encourage ambulation Advance to PO medication Voiding trials.   Surgical findings and procedure reviewed.  LOS: 1 day    Brook Matthew Saras 03/05/2016, 8:01 AM

## 2016-03-05 NOTE — Progress Notes (Signed)
Vaginal packing removed as ordered. Minimal amount of drainage noted; peri pad clean/dry. Patient tolerated well.

## 2016-03-05 NOTE — Progress Notes (Signed)
Patient unable to void.  Feeling uncomfortable, foley cath. Placed.

## 2016-03-05 NOTE — Addendum Note (Signed)
Addendum  created 03/05/16 0829 by Riki Sheer, CRNA   Modules edited: Clinical Notes   Clinical Notes:  File: [id number]

## 2016-03-06 MED ORDER — IBUPROFEN 600 MG PO TABS: 600.0000 mg | ORAL_TABLET | Freq: Four times a day (QID) | ORAL | Status: DC | PRN

## 2016-03-06 MED ORDER — OXYCODONE-ACETAMINOPHEN 5-325 MG PO TABS: 1.0000 | ORAL_TABLET | ORAL | Status: DC | PRN

## 2016-03-06 NOTE — Progress Notes (Signed)
2 Days Post-Op Procedure(s) (LRB): HYSTERECTOMY ABDOMINAL (N/A) ABDOMINO SACROCOLPOPEXY with HALBAN'S CULDOPLASTY (N/A) ANTERIOR (CYSTOCELE) AND POSTERIOR REPAIR (RECTOCELE) (N/A) TRANSVAGINAL TAPE (TVT) PROCEDURE exact midurethral sling (N/A) CYSTO (N/A) BILATERAL SALPINGECTOMY RIGHT OOPHORECTOMY  Subjective: Patient reports no problems voiding.   Tolerating regular diet.  Positive flatus.  Ambulating.   Had foley yesterday after was unable to void.  Foley out this am.  Was just able to void.   Objective: I have reviewed patient's vital signs and intake and output. T max 98.7, T now 98.1, BP 111/60, P 69, RR 18 I/O - 360 cc/2500 cc.   General: alert and cooperative Resp: clear to auscultation bilaterally Cardio: regular rate and rhythm, S1, S2 normal, no murmur, click, rub or gallop GI: soft, non-tender; bowel sounds normal; no masses,  no organomegaly and incision: clean, dry and intact Vaginal Bleeding: minimal  Assessment: s/p Procedure(s) with comments: HYSTERECTOMY ABDOMINAL (N/A) - ANESTHESIA CONSULT NEEDED ABDOMINO SACROCOLPOPEXY with HALBAN'S CULDOPLASTY (N/A) ANTERIOR (CYSTOCELE) AND POSTERIOR REPAIR (RECTOCELE) (N/A) TRANSVAGINAL TAPE (TVT) PROCEDURE exact midurethral sling (N/A) CYSTO (N/A) BILATERAL SALPINGECTOMY RIGHT OOPHORECTOMY: progressing well. No signs of infection clinically.   Plan: Discharge home  Instructions and precautions given.  Rx for Percocet and Motrin. Follow up in 4 days.    LOS: 2 days    Arloa Koh 03/06/2016, 7:47 AM

## 2016-03-06 NOTE — Progress Notes (Signed)
Spoke to Dr. Quincy Simmonds at 11:20 and reviewed voiding trial results from this morning.  See order for discharge.  Pt discharged to home with husband.  Condition stable.  Pt to car via wheelchair with RN.  No equipment for home ordered at discharge.

## 2016-03-06 NOTE — Discharge Instructions (Signed)
Abdominal Hysterectomy, Care After °Refer to this sheet in the next few weeks. These instructions provide you with information on caring for yourself after your procedure. Your health care provider may also give you more specific instructions. Your treatment has been planned according to current medical practices, but problems sometimes occur. Call your health care provider if you have any problems or questions after your procedure.  °WHAT TO EXPECT AFTER THE PROCEDURE °After your procedure, it is typical to have the following: °· Pain. °· Feeling tired. °· Poor appetite. °· Less interest in sex. °It takes 4-6 weeks to recover from this surgery.  °HOME CARE INSTRUCTIONS  °· Take pain medicines only as directed by your health care provider. Do not take over-the-counter pain medicines without checking with your health care provider first.  °· Change your bandage as directed by your health care provider. °· Return to your health care provider to have your sutures taken out. °· Take showers instead of baths for 2-3 weeks. Ask your health care provider when it is safe to start showering.  °· Do not douche, use tampons, or have sexual intercourse for at least 6 weeks or until your health care provider says you can.   °· Follow your health care provider's advice about exercise, lifting, driving, and general activities. °· Get plenty of rest and sleep.   °· Do not lift anything heavier than a gallon of milk (about 10 lb [4.5 kg]) for the first month after surgery. °· You can resume your normal diet if your health care provider says it is okay.   °· Do not drink alcohol until your health care provider says you can.   °· If you are constipated, ask your health care provider if you can take a mild laxative. °· Eating foods high in fiber may also help with constipation. Eat plenty of raw fruits and vegetables, whole grains, and beans. °· Drink enough fluids to keep your urine clear or pale yellow.   °· Try to have someone at  home with you for the first 1-2 weeks to help around the house. °· Keep all follow-up appointments. °SEEK MEDICAL CARE IF:  °· You have chills or fever. °· You have swelling, redness, or pain in the area of your incision that is getting worse.   °· You have pus coming from the incision.   °· You notice a bad smell coming from the incision or bandage.   °· Your incision breaks open.   °· You feel dizzy or light-headed.   °· You have pain or bleeding when you urinate.   °· You have persistent diarrhea.   °· You have persistent nausea and vomiting.   °· You have abnormal vaginal discharge.   °· You have a rash.   °· You have any type of abnormal reaction or develop an allergy to your medicine.   °· Your pain medicine is not helping.   °SEEK IMMEDIATE MEDICAL CARE IF:  °· You have a fever and your symptoms suddenly get worse. °· You have severe abdominal pain. °· You have chest pain. °· You have shortness of breath. °· You faint. °· You have pain, swelling, or redness of your leg. °· You have heavy vaginal bleeding with blood clots. °MAKE SURE YOU: °· Understand these instructions. °· Will watch your condition. °· Will get help right away if you are not doing well or get worse. °  °This information is not intended to replace advice given to you by your health care provider. Make sure you discuss any questions you have with your health care provider. °  °Document   Released: 03/14/2005 Document Revised: 09/15/2014 Document Reviewed: 06/17/2013 Elsevier Interactive Patient Education 2016 [employer]  Urethral Vaginal Sling, Care After Refer to this sheet in the next few weeks. These instructions provide you with information on caring for yourself after your procedure. Your health care provider may also give you more specific instructions. Your treatment has been planned according to current medical practices, but problems sometimes occur. Call your health care provider if you have any problems or questions after your  procedure.  WHAT TO EXPECT AFTER THE PROCEDURE  After your procedure, it is typical to have the following:  A catheter in your bladder until your bladder is able to work on its own properly. You will be instructed on how to empty the catheter bag.  Absorbable stitches in your incisions. They will slowly dissolve over 1-2 months. HOME CARE INSTRUCTIONS  Get plenty of rest.  Only take over-the-counter or prescription medicines as directed by your health care provider. Do not take aspirin because it can cause bleeding.  Do not take baths. Take showers until your health care provider tells you otherwise.  You may resume your usual diet. Eat a well-balanced diet.  Drink enough fluids to keep your urine clear or pale yellow.  Limit exercise and activities as directed by your health care provider. Do not lift anything heavier than 5 pounds (2.3 kg).  Do not douche, use tampons, or have sexual intercourse for 6 weeks after your procedure.  Follow up with your health care provider as directed. SEEK MEDICAL CARE IF:  You have a heavy or bad smelling vaginal discharge.   You have a rash.   You have pain that is not controlled with medicines.   You have lightheadedness or feel faint.  SEEK IMMEDIATE MEDICAL CARE IF:  You have a fever.   You have vaginal bleeding.   You faint.   You have shortness of breath.   You have chest, abdominal, or leg pain.   You have pain when urinating or cannot urinate.   Your catheter is still in your bladder and becomes blocked.   You have swelling, redness, and pain in the vaginal area.    This information is not intended to replace advice given to you by your health care provider. Make sure you discuss any questions you have with your health care provider.   Document Released: 06/15/2013 Document Reviewed: 06/15/2013 Elsevier Interactive Patient Education 2016 [employer].  Anterior and Posterior Colporrhaphy, Care After Refer  to this sheet in the next few weeks. These instructions provide you with information on caring for yourself after your procedure. Your health care provider may also give you more specific instructions. Your treatment has been planned according to current medical practices, but problems sometimes occur. Call your health care provider if you have any problems or questions after your procedure. HOME CARE INSTRUCTIONS   Take frequent rest periods throughout the day.   Only take over-the-counter or prescription medicines as directed by your health care provider.   Avoid strenuous activity such as heavy lifting (more than 10 pounds [4.5 kg]), pushing, and pulling until your health care provider says it is okay.   Take showers if your health care provider approves. Pat incisions dry. Do not rub incisions with a washcloth or towel. Do not take tub baths until your health care provider approves.   Wear compression stockings as directed by your health care provider. These stockings help prevent blood clots from forming in your legs.   Talk  with your health care provider about when you may return to work and your exercise routine.   Do not drive until your health care provider approves.   You may resume your normal diet. Eat a well-balanced diet.   Drink enough fluids to keep your urine clear or pale yellow.   Your normal bowel function should return. If you become constipated, you may:   Take a mild laxative.   Add fruit and bran to your diet.   Drink more fluids.  Do not have sexual intercourse until permitted by your health care provider.  Follow up with your health care provider as directed. SEEK MEDICAL CARE IF: You have persistent nausea or vomiting.  SEEK IMMEDIATE MEDICAL CARE IF:   You have increased bleeding (more than a small spot) from the vaginal area.   Your pain is not relieved with medicine or becomes worse.   You have redness, swelling, or increasing pain  in the vaginal area.   You have abdominal pain.   You see pus coming from the wounds.   You develop a fever.   You have a foul smell coming from your vaginal area.   You develop light-headedness or you feel faint.   You have difficulty breathing.  MAKE SURE YOU:  Understand these instructions.  Will watch your condition.  Will get help right away if you are not doing well or get worse.   This information is not intended to replace advice given to you by your health care provider. Make sure you discuss any questions you have with your health care provider.   Document Released: 03/13/2005 Document Revised: 04/27/2013 Document Reviewed: 01/14/2013 Elsevier Interactive Patient Education [employer].

## 2016-03-10 ENCOUNTER — Encounter: Payer: Self-pay | Admitting: Obstetrics and Gynecology

## 2016-03-10 ENCOUNTER — Ambulatory Visit (INDEPENDENT_AMBULATORY_CARE_PROVIDER_SITE_OTHER): Payer: BLUE CROSS/BLUE SHIELD | Admitting: Obstetrics and Gynecology

## 2016-03-10 VITALS — BP 120/64 | HR 76 | Ht 69.0 in | Wt 168.0 lb

## 2016-03-10 DIAGNOSIS — Z9889 Other specified postprocedural states: Secondary | ICD-10-CM

## 2016-03-10 NOTE — Progress Notes (Signed)
Patient ID: Tina Peck, female   DOB: [birth year], 60 y.o.   MRN: [id number] GYNECOLOGY  VISIT   HPI: 60 y.o.   Married  Caucasian  female   G6P6 with Patient's last menstrual period was 09/08/2006.   here for 1 week follow up HYSTERECTOMY ABDOMINAL (N/A )  ABDOMINO SACROCOLPOPEXY with HALBAN'S CULDOPLASTY (N/A )  ANTERIOR (CYSTOCELE) AND POSTERIOR REPAIR (RECTOCELE) (N/A )  TRANSVAGINAL TAPE (TVT) PROCEDURE exact midurethral sling (N/A )CYSTO (N/A )  BILATERAL SALPINGECTOMY RIGHT OOPHORECTOMY   Sister in law present for the discussion portion of the visit.   Pathology - adenomyosis: benign cervix, tubes, and ovaries.  Bowel function has returned.  Void well. Some burning pain in left groin when she gets into bed.  Walking at home.  Taking Motrin and Tylenol and alternating.   GYNECOLOGIC HISTORY: Patient's last menstrual period was 09/08/2006. Contraception:  Postmenopausal/Hysterectomy Menopausal hormone therapy:  none Last mammogram:  11-21-15 Density B/Neg/BiRads1:[facility] Last pap smear:   11-08-12 Neg:Neg HR HPV        OB History    Gravida Para Term Preterm AB TAB SAB Ectopic Multiple Living   6 6                 Patient Active Problem List   Diagnosis Date Noted  . 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 Prolapse of uterus 12/29/2012  . Cystocele 12/29/2012  . Seizure [facility])     Past Medical History  Diagnosis Date  . SVD (spontaneous vaginal delivery)     x 6  . Seizure [facility]) 2007    frontal lobe, only one seizure in 2007, none since    Past Surgical History  Procedure Laterality Date  . Tonsillectomy      as a child  . Oophorectomy Left 1986    LSO-Dermoid  . Colonoscopy      normal  . Diagnostic laparoscopy Left 1986    LSO for dermoid cyst  . Abdominal hysterectomy N/A 03/04/2016    Procedure: HYSTERECTOMY ABDOMINAL;  Surgeon: Nunzio Cobbs, MD;   Location: [facility];  Service: Gynecology;  Laterality: N/A;  ANESTHESIA CONSULT NEEDED  . Abdominal sacrocolpopexy N/A 03/04/2016    Procedure: ABDOMINO SACROCOLPOPEXY with HALBAN'S CULDOPLASTY;  Surgeon: Nunzio Cobbs, MD;  Location: [facility];  Service: Gynecology;  Laterality: N/A;  . Anterior and posterior repair N/A 03/04/2016    Procedure: ANTERIOR (CYSTOCELE) AND POSTERIOR REPAIR (RECTOCELE);  Surgeon: Nunzio Cobbs, MD;  Location: [facility];  Service: Gynecology;  Laterality: N/A;  . Bladder suspension N/A 03/04/2016    Procedure: TRANSVAGINAL TAPE (TVT) PROCEDURE exact midurethral sling;  Surgeon: Nunzio Cobbs, MD;  Location: [facility];  Service: Gynecology;  Laterality: N/A;  . Cysto N/A 03/04/2016    Procedure: Kathrene Alu;  Surgeon: Nunzio Cobbs, MD;  Location: [facility];  Service: Gynecology;  Laterality: N/A;  . Bilateral salpingectomy  03/04/2016    Procedure: BILATERAL SALPINGECTOMY;  Surgeon: Nunzio Cobbs, MD;  Location: [facility];  Service: Gynecology;;  . Oophorectomy  03/04/2016    Procedure: RIGHT OOPHORECTOMY;  Surgeon: Nunzio Cobbs, MD;  Location: [facility];  Service: Gynecology;;    Current Outpatient Prescriptions  Medication Sig Dispense Refill  . acetaminophen (TYLENOL) 500 MG tablet Take 500 mg by mouth every 6 (six) hours as needed. Takes 1-2 tablets every 4 hours    .  docusate sodium (COLACE) 100 MG capsule Take 100 mg by mouth daily.    Marland Kitchen ibuprofen (ADVIL,MOTRIN) 600 MG tablet Take 1 tablet (600 mg total) by mouth every 6 (six) hours as needed (mild pain). 30 tablet 0  . EPITOL 200 MG tablet Take 1 tablet by mouth 3 (three) times daily. Reported on 03/10/2016    . oxyCODONE-acetaminophen (PERCOCET/ROXICET) 5-325 MG tablet Take 1-2 tablets by mouth every 4 (four) hours as needed for severe pain (moderate to severe pain (when tolerating fluids)). (Patient not taking: Reported on 03/10/2016) 30 tablet 0   No current  facility-administered medications for this visit.     ALLERGIES: Review of patient's allergies indicates no known allergies.  Family History  Problem Relation Age of Onset  . Cancer Maternal Grandfather   . Osteoarthritis Mother   . Spina bifida Brother 0    lived 4 months  . Glaucoma Father 52  . Diabetes Paternal Grandmother     Social History   Social History  . Marital Status: Married    Spouse Name: N/A  . Number of Children: 6  . Years of Education: 12th   Occupational History  . homemaker    Social History Main Topics  . Smoking status: Never Smoker   . Smokeless tobacco: Never Used  . Alcohol Use: No     Comment: drinks coffee and tea daily  . Drug Use: No  . Sexual Activity:    Partners: Male    Birth Control/ Protection: Post-menopausal, Surgical     Comment: TAH/Bil.salpingectomy/RSO   Other Topics Concern  . Not on file   Social History Narrative   Patient lives at home with her husband and has 6 children. 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     ROS:  Pertinent items are noted in HPI.  PHYSICAL EXAMINATION:    BP 120/64 mmHg  Pulse 76  Ht 5\' 9"  (1.753 m)  Wt 168 lb (76.204 kg)  BMI 24.80 kg/m2  LMP 09/08/2006    General appearance: alert, cooperative and appears stated age   Abdomen: incision(s):Yes.  , __Pfannenstiel intact,__ soft, non-tender, no masses,  no organomegaly    Pelvic: External genitalia:  SP incision intact.              Urethra:  normal appearing urethra with no masses, tenderness or lesions              Bimanual Exam:                Vagina:  Suture lines intact without agglutination.  Midurethral mesh and vaginal cuff mesh - all protected and not palpable.              Uterus:  uterus absent              Adnexa: no mass, fullness, tenderness    Chaperone was present for exam.  ASSESSMENT  Doing well post op.  Voiding well.  PLAN  Continue decreased activity for 12 weeks post op.  No lifting  over 10 pounds.  Importance of ambulation discussed. Next post op visit in 5 weeks.    An After Visit Summary was printed and given to the patient.

## 2016-03-11 NOTE — Discharge Summary (Signed)
Physician Discharge Summary  Patient ID: Tina Peck MRN: [id number] DOB/AGE: 1955-12-27 60 y.o.  Admit date: 03/04/2016 Discharge date:  03/06/16 Admission Diagnoses: Complete uterovaginal prolapse.  Discharge Diagnoses:  1.  Complete uterovaginal prolapse. 2.  Total abdominal hysterectomy with bilateral salpingectomy, right oophorectomy, abdominal sacral colpopexy, Halban's culdoplasty, lysis of omental adhesions, TVT Exact midurethral sling and cystoscopy, anterior and posterior colporrhaphy. Active Problems:   Status post total abdominal hysterectomy   Discharged Condition: good  Hospital Course:  The patient was admitted on 03/04/16 for a Total abdominal hysterectomy with bilateral salpingectomy, right oophorectomy, abdominal sacral colpopexy, Halban's culdoplasty, lysis of omental adhesions,TVT Exact midurethral sling and cystoscopy, anterior and posterior colporrhaphy, which were performed without complication while under general anesthesia.  The patient's post op course was uneventful.  She had a morphine PCA and Toradol for pain control initially, and this was converted over to Percocet and Motrin on post op day one when the patient began taking po well.  She ambulated independently and wore PAS and Ted hose for DVT prophylaxis while in bed.  Her foley catheter were removed on post op day one, and she was unable to void.  Her catheter was replaced and then removed again on the morning of post op day two.  She then then able to void good volumes. The patient's vital signs remained stable and she demonstrated no signs of infection during her hospitalization.  The patient's post op day one Hgb was 11.7.  She was tolerating the this well.  She had very minimal vaginal bleeding, and her incision(s) demonstrated no signs of erythema or significant drainage.  She was found to be in good condition and ready for discharge on post op day two.  Consults: None  Significant Diagnostic  Studies: labs:  See hospital course.  Treatments: surgery:  Total abdominal hysterectomy with bilateral salpingectomy, right oophorectomy, abdominal sacral colpopexy, Halban's culdoplasty, lysis of omental adhesions, TVT Exact midurethral sling and cystoscopy, anterior and posterior colporrhaphy.  Discharge Exam: Blood pressure 111/60, pulse 69, temperature 98.1 F (36.7 C), temperature source Oral, resp. rate 18, height 5\' 8"  (1.727 m), weight 172 lb 4 oz (78.132 kg), last menstrual period 09/08/2006, SpO2 97 %. General: alert and cooperative Resp: clear to auscultation bilaterally Cardio: regular rate and rhythm, S1, S2 normal, no murmur, click, rub or gallop GI: soft, non-tender; bowel sounds normal; no masses, no organomegaly and incision: clean, dry and intact Vaginal Bleeding: minimal  Disposition: 01-Home or Self Care Discharge instructions were reviewed in verbal and written form.     Medication List    STOP taking these medications        aspirin 81 MG tablet      TAKE these medications        docusate sodium 100 MG capsule  Commonly known as:  COLACE  Take 100 mg by mouth daily.     EPITOL 200 MG tablet  Generic drug:  carbamazepine  Take 1 tablet by mouth 3 (three) times daily. Reported on 03/10/2016     ibuprofen 600 MG tablet  Commonly known as:  ADVIL,MOTRIN  Take 1 tablet (600 mg total) by mouth every 6 (six) hours as needed (mild pain).     oxyCODONE-acetaminophen 5-325 MG tablet  Commonly known as:  PERCOCET/ROXICET  Take 1-2 tablets by mouth every 4 (four) hours as needed for severe pain (moderate to severe pain (when tolerating fluids)).           Follow-up Information    Follow  up with Tina Koh, MD In 4 days.   Specialty:  Obstetrics and Gynecology   Contact information:   [address] [place] [address] [phone]       Signed: Arloa Peck 03/11/2016, 6:50 PM

## 2016-03-31 ENCOUNTER — Ambulatory Visit: Payer: 59 | Admitting: Nurse Practitioner

## 2016-04-07 ENCOUNTER — Ambulatory Visit: Payer: 59 | Admitting: Nurse Practitioner

## 2016-04-16 ENCOUNTER — Ambulatory Visit (INDEPENDENT_AMBULATORY_CARE_PROVIDER_SITE_OTHER): Payer: BLUE CROSS/BLUE SHIELD | Admitting: Obstetrics and Gynecology

## 2016-04-16 ENCOUNTER — Encounter: Payer: Self-pay | Admitting: Obstetrics and Gynecology

## 2016-04-16 VITALS — BP 122/76 | HR 68 | Ht 69.0 in | Wt 171.0 lb

## 2016-04-16 DIAGNOSIS — Z9889 Other specified postprocedural states: Secondary | ICD-10-CM

## 2016-04-16 NOTE — Progress Notes (Signed)
GYNECOLOGY  VISIT   HPI: 60 y.o.   Married  Caucasian  female   G6P6 with Patient's last menstrual period was 09/08/2006.   here for 6 week follow up HYSTERECTOMY ABDOMINAL (N/A ) ABDOMINO SACROCOLPOPEXY with HALBAN'S CULDOPLASTY (N/A ) ANTERIOR (CYSTOCELE) AND POSTERIOR REPAIR (RECTOCELE) (N/A ) TRANSVAGINAL TAPE (TVT) PROCEDURE exact midurethral sling (N/A )CYSTO (N/A ) BILATERAL SALPINGECTOMYRIGHT OOPHORECTOMY.  Energy is good.  Doing ironing for work.  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  No problem with bowel function.  Having some urinary leakage without warning.  Feels this is definitely urine.  No leakage with cough, sneeze or laugh.   GYNECOLOGIC HISTORY: Patient's last menstrual period was 09/08/2006. Contraception:  Postmenopausal/Hysterectomy Menopausal hormone therapy:  none Last mammogram: 11-21-15 Density B/Neg/[facility]  Last pap smear: 11-08-12 Neg:Neg HR HPV          OB History    Gravida Para Term Preterm AB Living   6 6           SAB TAB Ectopic Multiple Live Births                     Patient Active Problem List   Diagnosis Date Noted  . 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 Prolapse of uterus 12/29/2012  . Cystocele 12/29/2012  . Seizure [facility])     Past Medical History:  Diagnosis Date  . Seizure ([place]) 2007   frontal lobe, only one seizure in 2007, none since  . SVD (spontaneous vaginal delivery)    x 6    Past Surgical History:  Procedure Laterality Date  . ABDOMINAL HYSTERECTOMY N/A 03/04/2016   Procedure: HYSTERECTOMY ABDOMINAL;  Surgeon: Nunzio Cobbs, MD;  Location: [facility];  Service: Gynecology;  Laterality: N/A;  ANESTHESIA CONSULT NEEDED  . ABDOMINAL SACROCOLPOPEXY N/A 03/04/2016   Procedure: ABDOMINO SACROCOLPOPEXY with HALBAN'S CULDOPLASTY;  Surgeon: Nunzio Cobbs, MD;  Location: [facility];  Service: Gynecology;  Laterality: N/A;  .  ANTERIOR AND POSTERIOR REPAIR N/A 03/04/2016   Procedure: ANTERIOR (CYSTOCELE) AND POSTERIOR REPAIR (RECTOCELE);  Surgeon: Nunzio Cobbs, MD;  Location: [facility];  Service: Gynecology;  Laterality: N/A;  . BILATERAL SALPINGECTOMY  03/04/2016   Procedure: BILATERAL SALPINGECTOMY;  Surgeon: Nunzio Cobbs, MD;  Location: [facility];  Service: Gynecology;;  . BLADDER SUSPENSION N/A 03/04/2016   Procedure: TRANSVAGINAL TAPE (TVT) PROCEDURE exact midurethral sling;  Surgeon: Nunzio Cobbs, MD;  Location: [facility];  Service: Gynecology;  Laterality: N/A;  . COLONOSCOPY     normal  . CYSTO N/A 03/04/2016   Procedure: CYSTO;  Surgeon: Nunzio Cobbs, MD;  Location: [facility];  Service: Gynecology;  Laterality: N/A;  . DIAGNOSTIC LAPAROSCOPY Left 1986   LSO for dermoid cyst  . OOPHORECTOMY Left 1986   LSO-Dermoid  . OOPHORECTOMY  03/04/2016   Procedure: RIGHT OOPHORECTOMY;  Surgeon: Nunzio Cobbs, MD;  Location: [facility];  Service: Gynecology;;  . TONSILLECTOMY     as a child    Current Outpatient Prescriptions  Medication Sig Dispense Refill  . docusate sodium (COLACE) 100 MG capsule Take 100 mg by mouth daily.    . EPITOL 200 MG tablet Take 1 tablet by mouth 3 (three) times daily. Reported on 03/10/2016     No current facility-administered medications for this visit.      ALLERGIES:  Review of patient's allergies indicates no known allergies.  Family History  Problem Relation Age of Onset  . Osteoarthritis Mother   . Glaucoma Father 31  . Cancer Maternal Grandfather   . Spina bifida Brother 0    lived 4 months  . Diabetes Paternal Grandmother     Social History   Social History  . Marital status: Married    Spouse name: N/A  . Number of children: 6  . Years of education: 12th   Occupational History  . homemaker    Social History Main Topics  . Smoking status: Never Smoker  . Smokeless tobacco: Never Used  . Alcohol use No     Comment:  drinks coffee and tea daily  . Drug use: No  . Sexual activity: Yes    Partners: Male    Birth control/ protection: Post-menopausal, Surgical     Comment: TAH/Bil.salpingectomy/RSO   Other Topics Concern  . Not on file   Social History Narrative   Patient lives at home with her husband and has 6 children. 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     ROS:  Pertinent items are noted in HPI.  PHYSICAL EXAMINATION:    BP 122/76 (BP Location: Right Arm, Patient Position: Sitting, Cuff Size: Normal)   Pulse 68   Ht 5\' 9"  (1.753 m)   Wt 171 lb (77.6 kg)   LMP 09/08/2006   BMI 25.25 kg/m     General appearance: alert, cooperative and appears stated age   Abdomen: Pfannensteil incision intact, soft, non-tender, no masses,  no organomegaly   Pelvic: External genitalia:  no lesions              Urethra:  normal appearing urethra with no masses, tenderness or lesions              Bartholins and Skenes: normal                 Vagina: normal appearing vagina with normal color and discharge, no lesions              Cervix: no lesions                Bimanual Exam:  Uterus:  normal size, contour, position, consistency, mobility, non-tender              Adnexa: no mass, fullness, tenderness             Chaperone was present for exam.  ASSESSMENT  Doing well post op.  Urinary leakage.  Overactive bladder versus drainage from sutures.   PLAN  Discussed medication for overactive bladder if needed.  Will observe for now. Final post op and recheck in 6 weeks.  No lifting over 10 pounds until after next visit.    An After Visit Summary was printed and given to the patient.

## 2016-04-16 NOTE — Patient Instructions (Signed)
Please do not lift, carry, pull, or haul over 10 pounds for the next 6 weeks.

## 2016-04-18 ENCOUNTER — Encounter: Payer: Self-pay | Admitting: Obstetrics and Gynecology

## 2016-04-28 ENCOUNTER — Ambulatory Visit (INDEPENDENT_AMBULATORY_CARE_PROVIDER_SITE_OTHER): Payer: BLUE CROSS/BLUE SHIELD | Admitting: Nurse Practitioner

## 2016-04-28 ENCOUNTER — Encounter: Payer: Self-pay | Admitting: Nurse Practitioner

## 2016-04-28 VITALS — BP 102/69 | HR 75 | Ht 69.0 in | Wt 173.8 lb

## 2016-04-28 DIAGNOSIS — G40309 Generalized idiopathic epilepsy and epileptic syndromes, not intractable, without status epilepticus: Secondary | ICD-10-CM

## 2016-04-28 DIAGNOSIS — R569 Unspecified convulsions: Secondary | ICD-10-CM | POA: Diagnosis not present

## 2016-04-28 MED ORDER — EPITOL 200 MG PO TABS: 200.0000 mg | ORAL_TABLET | Freq: Three times a day (TID) | ORAL | 11 refills | Status: DC

## 2016-04-28 NOTE — Patient Instructions (Signed)
Continue Epitol at current dose will refill for 1 year Labs today ,CBC, CMP and CBZ F/U yearly and prn Call for seizure activity 

## 2016-04-28 NOTE — Progress Notes (Signed)
[facility]  PATIENT: Tina Peck DOB: September 14, 1955   REASON FOR VISIT: Follow-up for seizure disorder  HISTORY FROM: patient     HISTORY OF PRESENT ILLNESS: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 She is taking and tolerating Epitol. She denies any dj vu, strange odors or taste. She has not had further seizure activity. She is tolerating her medication without side effects. EEG in the past was abnormal showing left frontotemporal epileptiform activity. She returns for reevaluation.    REVIEW OF SYSTEMS: Full 14 system review of systems performed and notable only for those listed, all others are neg:  Constitutional: neg  Cardiovascular: neg Ear/Nose/Throat: neg  Skin: neg Eyes: neg Respiratory: neg Gastroitestinal: neg  Hematology/Lymphatic: neg  Endocrine: neg Musculoskeletal:neg Allergy/Immunology: neg Neurological: Isolated seizure Psychiatric: neg Sleep : neg   ALLERGIES: No Known Allergies  HOME MEDICATIONS: Outpatient Medications Prior to Visit  Medication Sig Dispense Refill  . docusate sodium (COLACE) 100 MG capsule Take 100 mg by mouth daily.    . EPITOL 200 MG tablet Take 1 tablet by mouth 3 (three) times daily. Reported on 03/10/2016     No facility-administered medications prior to visit.     PAST MEDICAL HISTORY: Past Medical History:  Diagnosis Date  . Seizure ([place]) 2007   frontal lobe, only one seizure in 2007, none since  . SVD (spontaneous vaginal delivery)    x 6    PAST SURGICAL HISTORY: Past Surgical History:  Procedure Laterality Date  . ABDOMINAL HYSTERECTOMY N/A 03/04/2016   Procedure: HYSTERECTOMY ABDOMINAL;  Surgeon: Nunzio Cobbs, MD;  Location: [facility];  Service: Gynecology;  Laterality: N/A;  ANESTHESIA CONSULT NEEDED  . ABDOMINAL SACROCOLPOPEXY N/A 03/04/2016   Procedure: ABDOMINO  SACROCOLPOPEXY with HALBAN'S CULDOPLASTY;  Surgeon: Nunzio Cobbs, MD;  Location: [facility];  Service: Gynecology;  Laterality: N/A;  . ANTERIOR AND POSTERIOR REPAIR N/A 03/04/2016   Procedure: ANTERIOR (CYSTOCELE) AND POSTERIOR REPAIR (RECTOCELE);  Surgeon: Nunzio Cobbs, MD;  Location: [facility];  Service: Gynecology;  Laterality: N/A;  . BILATERAL SALPINGECTOMY  03/04/2016   Procedure: BILATERAL SALPINGECTOMY;  Surgeon: Nunzio Cobbs, MD;  Location: [facility];  Service: Gynecology;;  . BLADDER SUSPENSION N/A 03/04/2016   Procedure: TRANSVAGINAL TAPE (TVT) PROCEDURE exact midurethral sling;  Surgeon: Nunzio Cobbs, MD;  Location: [facility];  Service: Gynecology;  Laterality: N/A;  . COLONOSCOPY     normal  . CYSTO N/A 03/04/2016   Procedure: CYSTO;  Surgeon: Nunzio Cobbs, MD;  Location: [facility];  Service: Gynecology;  Laterality: N/A;  . DIAGNOSTIC LAPAROSCOPY Left 1986   LSO for dermoid cyst  . OOPHORECTOMY Left 1986   LSO-Dermoid  . OOPHORECTOMY  03/04/2016   Procedure: RIGHT OOPHORECTOMY;  Surgeon: Nunzio Cobbs, MD;  Location: [facility];  Service: Gynecology;;  . TONSILLECTOMY     as a child    FAMILY HISTORY: Family History  Problem Relation Age of Onset  . Osteoarthritis Mother   . Glaucoma Father 29  . Cancer Maternal Grandfather   . Spina bifida Brother 0    lived 4 months  . Diabetes Paternal Grandmother     SOCIAL HISTORY: Social History   Social History  . Marital status: Married    Spouse name: N/A  . Number of children: 6  . Years of education:  12th   Occupational History  . homemaker    Social History Main Topics  . Smoking status: Never Smoker  . Smokeless tobacco: Never Used  . Alcohol use No     Comment: drinks coffee and tea daily  . Drug use: No  . Sexual activity: Yes    Partners: Male    Birth control/ protection: Post-menopausal, Surgical     Comment: TAH/Bil.salpingectomy/RSO   Other Topics  Concern  . Not on file   Social History Narrative   Patient lives at home with her husband and has 6 children. 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      PHYSICAL EXAM  Vitals:   04/28/16 1314  BP: 102/69  Pulse: 75  Weight: 173 lb 12.8 oz (78.8 kg)  Height: 5\' 9"  (1.753 m)   Body mass index is 25.67 kg/m. General: well developed, well nourished, seated, in no evident distress  Head: head normocephalic and atraumatic. Oropharynx benign  Neck: supple with no carotid or supraclavicular bruits  Neurologic Exam  Mental Status: Awake and fully alert. Follows all commands. Mood and affect appropriate.  Cranial Nerves: Pupils equal, briskly reactive to light. Extraocular movements full without nystagmus. Visual fields full to confrontation. Hearing intact and symmetric to finger snap. Facial sensation intact. Face, tongue, palate move normally and symmetrically. Neck flexion and extension normal.  Motor: Normal bulk and tone. Normal strength in all tested extremity muscles.  Coordination: Rapid alternating movements normal in all extremities. Finger-to-nose and heel-to-shin performed accurately bilaterally.  Gait and Station: Arises from chair without difficulty. Stance is normal. Gait demonstrates normal stride length and balance . Able to heel, toe and tandem walk without difficulty.  Reflexes:2+ and symmetric. Toes downgoing.    DIAGNOSTIC DATA (LABS, IMAGING, TESTING) -  ASSESSMENT AND PLAN  60 y.o. year old female  has a past medical history of Seizure (2007). Here  to follow up. Her seizure activity is currently well-controlled on Epitol..  Continue Epitol at current dose will refill for 1 year Labs today ,CBC, CMP and CBZ F/U yearly and prn Call for seizure activity Dennie Bible, [facility], [facility], [facility] [address], [place], [place] [address] [phone]

## 2016-04-29 ENCOUNTER — Telehealth: Payer: Self-pay | Admitting: *Deleted

## 2016-04-29 LAB — COMPREHENSIVE METABOLIC PANEL
ALBUMIN: 4 g/dL (ref 3.6–4.8)
ALK PHOS: 125 IU/L — AB (ref 39–117)
ALT: 17 IU/L (ref 0–32)
AST: 17 IU/L (ref 0–40)
Albumin/Globulin Ratio: 1.7 (ref 1.2–2.2)
BUN / CREAT RATIO: 14 (ref 12–28)
BUN: 11 mg/dL (ref 8–27)
Bilirubin Total: <0.2 mg/dL (ref 0.0–1.2)
CO2: 23 mmol/L (ref 18–29)
CREATININE: 0.8 mg/dL (ref 0.57–1.00)
Calcium: 8.8 mg/dL (ref 8.7–10.3)
Chloride: 103 mmol/L (ref 96–106)
GFR, EST AFRICAN AMERICAN: 91 mL/min/{1.73_m2} (ref >59)
GFR, EST NON AFRICAN AMERICAN: 79 mL/min/{1.73_m2} (ref >59)
GLOBULIN, TOTAL: 2.3 g/dL (ref 1.5–4.5)
Glucose: 103 mg/dL — ABNORMAL HIGH (ref 65–99)
Potassium: 5 mmol/L (ref 3.5–5.2)
SODIUM: 142 mmol/L (ref 134–144)
TOTAL PROTEIN: 6.3 g/dL (ref 6.0–8.5)

## 2016-04-29 LAB — CBC WITH DIFFERENTIAL/PLATELET
BASOS: 1 %
Basophils Absolute: 0.1 10*3/uL (ref 0.0–0.2)
EOS (ABSOLUTE): 0.3 10*3/uL (ref 0.0–0.4)
EOS: 5 %
HEMATOCRIT: 38.2 % (ref 34.0–46.6)
Hemoglobin: 12.9 g/dL (ref 11.1–15.9)
Immature Grans (Abs): 0 10*3/uL (ref 0.0–0.1)
Immature Granulocytes: 0 %
LYMPHS ABS: 2.3 10*3/uL (ref 0.7–3.1)
Lymphs: 32 %
MCH: 29.9 pg (ref 26.6–33.0)
MCHC: 33.8 g/dL (ref 31.5–35.7)
MCV: 88 fL (ref 79–97)
MONOS ABS: 0.6 10*3/uL (ref 0.1–0.9)
Monocytes: 8 %
NEUTROS ABS: 4 10*3/uL (ref 1.4–7.0)
Neutrophils: 54 %
Platelets: 349 10*3/uL (ref 150–379)
RBC: 4.32 x10E6/uL (ref 3.77–5.28)
RDW: 13.9 % (ref 12.3–15.4)
WBC: 7.3 10*3/uL (ref 3.4–10.8)

## 2016-04-29 LAB — CARBAMAZEPINE LEVEL, TOTAL: Carbamazepine (Tegretol), S: 5.8 ug/mL (ref 4.0–12.0)

## 2016-04-29 NOTE — Telephone Encounter (Signed)
-----   Message from Dennie Bible, NP sent at 04/29/2016  7:52 AM EDT ----- Labs look good. Please call patient

## 2016-04-30 ENCOUNTER — Ambulatory Visit: Payer: BLUE CROSS/BLUE SHIELD | Admitting: Obstetrics and Gynecology

## 2016-05-01 NOTE — Telephone Encounter (Signed)
Spoke to pt and relayed that her lab results were good.  She verbalized understanding.

## 2016-06-02 ENCOUNTER — Encounter: Payer: Self-pay | Admitting: Obstetrics and Gynecology

## 2016-06-02 ENCOUNTER — Ambulatory Visit (INDEPENDENT_AMBULATORY_CARE_PROVIDER_SITE_OTHER): Payer: BLUE CROSS/BLUE SHIELD | Admitting: Obstetrics and Gynecology

## 2016-06-02 ENCOUNTER — Telehealth: Payer: Self-pay

## 2016-06-02 VITALS — BP 106/70 | HR 70 | Resp 16 | Ht 68.25 in | Wt 172.0 lb

## 2016-06-02 DIAGNOSIS — Z0000 Encounter for general adult medical examination without abnormal findings: Secondary | ICD-10-CM | POA: Diagnosis not present

## 2016-06-02 DIAGNOSIS — L989 Disorder of the skin and subcutaneous tissue, unspecified: Secondary | ICD-10-CM

## 2016-06-02 DIAGNOSIS — N952 Postmenopausal atrophic vaginitis: Secondary | ICD-10-CM

## 2016-06-02 DIAGNOSIS — Z01419 Encounter for gynecological examination (general) (routine) without abnormal findings: Secondary | ICD-10-CM

## 2016-06-02 DIAGNOSIS — Z780 Asymptomatic menopausal state: Secondary | ICD-10-CM | POA: Diagnosis not present

## 2016-06-02 LAB — POCT URINALYSIS DIPSTICK
Bilirubin, UA: NEGATIVE
Blood, UA: NEGATIVE
Glucose, UA: NEGATIVE
KETONES UA: NEGATIVE
LEUKOCYTES UA: NEGATIVE
Nitrite, UA: NEGATIVE
PH UA: 5
PROTEIN UA: NEGATIVE
Urobilinogen, UA: NEGATIVE

## 2016-06-02 MED ORDER — ESTROGENS, CONJUGATED 0.625 MG/GM VA CREA: TOPICAL_CREAM | VAGINAL | 2 refills | Status: DC

## 2016-06-02 NOTE — Progress Notes (Signed)
Scheduled patient while in office for appointment at [facility] on 07/07/2016 at 8 am with Marline Backbone, PA. Patient declines earlier appointments due to her schedule and upcoming eye surgery.

## 2016-06-02 NOTE — Progress Notes (Signed)
60 y.o. G7P6 Married Caucasian female here for annual exam.    Patient having sharp pain with intercourse.  Husband feels like he is hitting something.  No bleeding.  This is the second time they tried to have intercourse.  No vaginal bleeding.  Not a drop.  Using massage oil and Vaseline.  Not using any vaginal estrogen locally.   No urinary leakage.   Up twice a night to void.  During the day, frequency is every 3 - 4 hours.  Drinks black tea at night.   Normal bowel function.  Uses Miralax and Colace.   PCP:   Dr. Jerilynn Mages. [place].  Patient's last menstrual period was 09/08/2006.           Sexually active: Yes.    The current method of family planning is status post hysterectomy.    Exercising: Yes.    walking Smoker:  no  Health Maintenance: Pap:  11-08-12 neg HPV HR neg.  Cervix at hysterectomy - benign.  History of abnormal Pap:  no MMG:  11-19-15 category b density birads 1:neg Colonoscopy: 4/17 f/u 10 yrs.    BMD: current with neurology due to anti-seizure med. This was done many years ago.  Result: normal BMD TDaP: 7/16 HIV: not done Hep C: not done Screening Labs:  Hb today: pcp, Urine today: neg Self breast exam: done monthly   reports that she has never smoked. She has never used smokeless tobacco. She reports that she does not drink alcohol or use drugs.  Past Medical History:  Diagnosis Date  . Seizure ([place]) 2007   frontal lobe, only one seizure in 2007, none since  . SVD (spontaneous vaginal delivery)    x 6    Past Surgical History:  Procedure Laterality Date  . ABDOMINAL HYSTERECTOMY N/A 03/04/2016   Procedure: HYSTERECTOMY ABDOMINAL;  Surgeon: Nunzio Cobbs, MD;  Location: [facility];  Service: Gynecology;  Laterality: N/A;  ANESTHESIA CONSULT NEEDED  . ABDOMINAL SACROCOLPOPEXY N/A 03/04/2016   Procedure: ABDOMINO SACROCOLPOPEXY with HALBAN'S CULDOPLASTY;  Surgeon: Nunzio Cobbs, MD;  Location: [facility];  Service: Gynecology;  Laterality:  N/A;  . ANTERIOR AND POSTERIOR REPAIR N/A 03/04/2016   Procedure: ANTERIOR (CYSTOCELE) AND POSTERIOR REPAIR (RECTOCELE);  Surgeon: Nunzio Cobbs, MD;  Location: [facility];  Service: Gynecology;  Laterality: N/A;  . BILATERAL SALPINGECTOMY  03/04/2016   Procedure: BILATERAL SALPINGECTOMY;  Surgeon: Nunzio Cobbs, MD;  Location: [facility];  Service: Gynecology;;  . BLADDER SUSPENSION N/A 03/04/2016   Procedure: TRANSVAGINAL TAPE (TVT) PROCEDURE exact midurethral sling;  Surgeon: Nunzio Cobbs, MD;  Location: [facility];  Service: Gynecology;  Laterality: N/A;  . COLONOSCOPY     normal  . CYSTO N/A 03/04/2016   Procedure: CYSTO;  Surgeon: Nunzio Cobbs, MD;  Location: [facility];  Service: Gynecology;  Laterality: N/A;  . DIAGNOSTIC LAPAROSCOPY Left 1986   LSO for dermoid cyst  . OOPHORECTOMY Left 1986   LSO-Dermoid  . OOPHORECTOMY  03/04/2016   Procedure: RIGHT OOPHORECTOMY;  Surgeon: Nunzio Cobbs, MD;  Location: [facility];  Service: Gynecology;;  . TONSILLECTOMY     as a child    Current Outpatient Prescriptions  Medication Sig Dispense Refill  . aspirin 81 MG chewable tablet Chew 81 mg by mouth daily.    Marland Kitchen docusate sodium (COLACE) 100 MG capsule Take 100 mg by mouth daily.    Roddie Mc  200 MG tablet Take 1 tablet (200 mg total) by mouth 3 (three) times daily. Reported on 03/10/2016 90 tablet 11  . polyethylene glycol (MIRALAX / GLYCOLAX) packet Take 17 g by mouth daily. 1/2 cap po daily    . Cholecalciferol (VITAMIN D3) 5000 units CAPS Take 2 capsules by mouth 2 (two) times daily.    . cyanocobalamin 1000 MCG tablet Take 1,000 mcg by mouth daily.     No current facility-administered medications for this visit.     Family History  Problem Relation Age of Onset  . Osteoarthritis Mother   . Glaucoma Father 83  . Cancer Maternal Grandfather   . Spina bifida Brother 0    lived 4 months  . Diabetes Paternal Grandmother     ROS:  Pertinent items are  noted in HPI.  Otherwise, a comprehensive ROS was negative.  Exam:   BP 106/70   Pulse 70   Resp 16   Ht 5' 8.25" (1.734 m)   Wt 172 lb (78 kg)   LMP 09/08/2006   BMI 25.96 kg/m     General appearance: alert, cooperative and appears stated age Head: Normocephalic, without obvious abnormality, atraumatic Neck: no adenopathy, supple, symmetrical, trachea midline and thyroid normal to inspection and palpation Lungs: clear to auscultation bilaterally Breasts: normal appearance, no masses or tenderness, No nipple retraction or dimpling, No nipple discharge or bleeding, No axillary or supraclavicular adenopathy Heart: regular rate and rhythm Abdomen: Pfannenstiel incision intact, soft, non-tender; no masses, no organomegaly Extremities: extremities normal, atraumatic, no cyanosis or edema Skin: Skin color, texture, turgor normal. Left upper abdomen with 2.5 x 1.5 cm red raised lesion, tender.  Lymph nodes: Cervical, supraclavicular, and axillary nodes normal. No abnormal inguinal nodes palpated Neurologic: Grossly normal  Pelvic: External genitalia:  no lesions              Urethra:  normal appearing urethra with no masses, tenderness or lesions.  Midurethral sling protected.  Positioning of right side of sling is more palpable than left side of sling.               Bartholins and Skenes: normal                 Vagina: normal appearing vagina with normal color and discharge, no lesions              Cervix:  Absent.  Mesh protected.               Pap taken: No. Bimanual Exam:  Uterus:  normal size, contour, position, consistency, mobility, non-tender              Adnexa: no mass, fullness, tenderness              Rectal exam: Yes.  .  Confirms.              Anus:  normal sphincter tone, no lesions  Chaperone was present for exam.  Assessment:   Well woman visit with normal exam. Dyspareunia.  Vaginal atrophy.  Skin lesion.  Menopausal female.   Loss of height.   Plan: Yearly  mammogram recommended after age 66.  Recommended self breast exam.  Pap and HR HPV as above. Guidelines for Calcium, Vitamin D, regular exercise program including cardiovascular and weight bearing exercise. Labs - cholesterol, Vit D, TSH. BMD when mammogram is done.  Order placed.  Premarin vaginal cream 1/2 gm pv at hs for 2 weeks then 1/2 gm pv  at hs twice weekly.  Dispense 30 grams, RF 2.  Coupon given.  Discussed risk of DVT, PE, MI stroke, and breast cancer.  Wait several weeks prior to resuming sexual activity.  Use coconut oil with intercourse.  Call if pain persists with intercourse. Referral to dermatology.  Follow up annually and prn.       After visit summary provided.

## 2016-06-02 NOTE — Patient Instructions (Signed)

## 2016-06-02 NOTE — Telephone Encounter (Signed)
Pt left office today & forgot to get her labs drawn. Message left for patient to call & make appt to get labwork drawn.

## 2016-06-03 NOTE — Telephone Encounter (Signed)
appt scheduled for Wednesday 06-04-16 at [facility]

## 2016-06-04 ENCOUNTER — Other Ambulatory Visit (INDEPENDENT_AMBULATORY_CARE_PROVIDER_SITE_OTHER): Payer: BLUE CROSS/BLUE SHIELD

## 2016-06-04 DIAGNOSIS — Z0000 Encounter for general adult medical examination without abnormal findings: Secondary | ICD-10-CM

## 2016-06-05 LAB — LIPID PANEL
Cholesterol: 202 mg/dL — ABNORMAL HIGH (ref 125–200)
HDL: 56 mg/dL (ref >=46)
LDL CALC: 130 mg/dL — AB (ref <130)
TRIGLYCERIDES: 81 mg/dL (ref <150)
Total CHOL/HDL Ratio: 3.6 Ratio (ref <=5.0)
VLDL: 16 mg/dL (ref <30)

## 2016-06-05 LAB — VITAMIN D 25 HYDROXY (VIT D DEFICIENCY, FRACTURES): Vit D, 25-Hydroxy: 36 ng/mL (ref 30–100)

## 2016-06-05 LAB — TSH: TSH: 0.8 mIU/L

## 2016-10-28 ENCOUNTER — Telehealth: Payer: Self-pay | Admitting: Nurse Practitioner

## 2016-10-28 NOTE — Telephone Encounter (Signed)
Patient is calling to discuss if there is anyway to find out if she has a genetic blood disorder from the lab she previously has had in our office.. Her son passed away from a  genetic blood disease.

## 2016-10-28 NOTE — Telephone Encounter (Signed)
Spoke to pt and I told her that genetic testing is very specific.  Usually goes to a speciality lab depending on what genectic disease testing for.  We did CMP, CBC with DIFF and Carbamazepine level back in 04/2016.  She knew it was a shot in the dark and appreciated the call back.

## 2017-04-26 NOTE — Progress Notes (Signed)
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 She is taking and tolerating Epitol. She denies any dj vu, strange odors or taste. She has not had further seizure activity. She is tolerating her medication without side effects. EEG in the past was abnormal showing left frontotemporal epileptiform activity. She recently lost her son due to a pulmonary embolus at 60. She is very tearful today. She is going to berievement She returns for reevaluation.    REVIEW OF SYSTEMS: Full 14 system review of systems performed and notable only for those listed, all others are neg:  Constitutional: neg  Cardiovascular: neg Ear/Nose/Throat: neg  Skin: neg Eyes: neg Respiratory: neg Gastroitestinal: neg  Hematology/Lymphatic: neg  Endocrine: neg Musculoskeletal:neg Allergy/Immunology: neg Neurological: Isolated seizure Psychiatric: Situational depression Sleep : neg   ALLERGIES: No Known Allergies  HOME MEDICATIONS: Outpatient Medications Prior to Visit  Medication Sig Dispense Refill  . aspirin 81 MG chewable tablet Chew 81 mg by mouth daily.    . Cholecalciferol (VITAMIN D3) 5000 units CAPS Take 2 capsules by mouth 2 (two) times daily.    Marland Kitchen conjugated estrogens (PREMARIN) vaginal cream Use 1/2 g vaginally every night at bed time for the first 2 weeks, then use 1/2 g vaginally two or three times per week. 30 g 2  . cyanocobalamin 1000 MCG tablet Take 1,000 mcg by mouth daily.    Marland Kitchen docusate sodium (COLACE) 100 MG capsule Take 100 mg by mouth daily.    . EPITOL 200 MG tablet Take 1 tablet (200 mg total) by mouth 3 (three) times daily. Reported on 03/10/2016 90 tablet 11  . polyethylene glycol (MIRALAX /  GLYCOLAX) packet Take 17 g by mouth daily. 1/2 cap po daily     No facility-administered medications prior to visit.     PAST MEDICAL HISTORY: Past Medical History:  Diagnosis Date  . Seizure ([place]) 2007   frontal lobe, only one seizure in 2007, none since  . SVD (spontaneous vaginal delivery)    x 6    PAST SURGICAL HISTORY: Past Surgical History:  Procedure Laterality Date  . ABDOMINAL HYSTERECTOMY N/A 03/04/2016   Procedure: HYSTERECTOMY ABDOMINAL;  Surgeon: Nunzio Cobbs, MD;  Location: [facility];  Service: Gynecology;  Laterality: N/A;  ANESTHESIA CONSULT NEEDED  . ABDOMINAL SACROCOLPOPEXY N/A 03/04/2016   Procedure: ABDOMINO SACROCOLPOPEXY with HALBAN'S CULDOPLASTY;  Surgeon: Nunzio Cobbs, MD;  Location: [facility];  Service: Gynecology;  Laterality: N/A;  . ANTERIOR AND POSTERIOR REPAIR N/A 03/04/2016   Procedure: ANTERIOR (CYSTOCELE) AND POSTERIOR REPAIR (RECTOCELE);  Surgeon: Nunzio Cobbs, MD;  Location: [facility];  Service: Gynecology;  Laterality: N/A;  . BILATERAL SALPINGECTOMY  03/04/2016   Procedure: BILATERAL SALPINGECTOMY;  Surgeon: Nunzio Cobbs, MD;  Location: [facility];  Service: Gynecology;;  . BLADDER SUSPENSION N/A 03/04/2016   Procedure: TRANSVAGINAL TAPE (TVT) PROCEDURE exact midurethral sling;  Surgeon: Nunzio Cobbs, MD;  Location: [facility];  Service: Gynecology;  Laterality: N/A;  . COLONOSCOPY     normal  . CYSTO N/A 03/04/2016   Procedure: CYSTO;  Surgeon: Nunzio Cobbs, MD;  Location: [facility];  Service: Gynecology;  Laterality: N/A;  . DIAGNOSTIC LAPAROSCOPY Left  1986   LSO for dermoid cyst  . OOPHORECTOMY Left 1986   LSO-Dermoid  . OOPHORECTOMY  03/04/2016   Procedure: RIGHT OOPHORECTOMY;  Surgeon: Nunzio Cobbs, MD;  Location: [facility];  Service: Gynecology;;  . TONSILLECTOMY     as a child    FAMILY HISTORY: Family History  Problem Relation Age of Onset  . Osteoarthritis Mother   .  Glaucoma Father 35  . Cancer Maternal Grandfather   . Spina bifida Brother 0       lived 4 months  . Diabetes Paternal Grandmother     SOCIAL HISTORY: Social History   Social History  . Marital status: Married    Spouse name: N/A  . Number of children: 6  . Years of education: 12th   Occupational History  . homemaker    Social History Main Topics  . Smoking status: Never Smoker  . Smokeless tobacco: Never Used  . Alcohol use No  . Drug use: No  . Sexual activity: Yes    Partners: Male    Birth control/ protection: Post-menopausal, Surgical     Comment: TAH/Bil.salpingectomy/RSO   Other Topics Concern  . Not on file   Social History Narrative   Patient lives at home with her husband and has 6 children. 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      PHYSICAL EXAM  Vitals:   04/27/17 0723  BP: 118/71  Pulse: 66  Weight: 170 lb 3.2 oz (77.2 kg)  Height: 5' 8.25" (1.734 m)   Body mass index is 25.69 kg/m. General: well developed, well nourished, seated, in no evident distress  Head: head normocephalic and atraumatic. Oropharynx benign  Neck: supple with no carotid  bruits  Neurologic Exam  Mental Status: Awake and fully alert. Follows all commands. Mood and affect appropriate.  Cranial Nerves: Pupils equal, briskly reactive to light. Extraocular movements full without nystagmus. Visual fields full to confrontation. Hearing intact and symmetric to finger snap. Facial sensation intact. Face, tongue, palate move normally and symmetrically. Neck flexion and extension normal.  Motor: Normal bulk and tone. Normal strength in all tested extremity muscles.  Coordination: Rapid alternating movements normal in all extremities. Finger-to-nose and heel-to-shin performed accurately bilaterally.  Gait and Station: Arises from chair without difficulty. Stance is normal. Gait demonstrates normal stride length and balance . Able to heel, toe and tandem walk  without difficulty.  Reflexes:2+ and symmetric. Toes downgoing.    DIAGNOSTIC DATA (LABS, IMAGING, TESTING) -  ASSESSMENT AND PLAN  61 y.o. year old female  has a past medical history of Seizure (2007). here  to follow up. Her seizure activity is currently well-controlled on Epitol..  Continue Epitol at current dose will refill for 1 year Labs today ,CBC, CMP and CBZ F/U yearly and prn Call for seizure activity Dennie Bible, [facility], [facility], [facility] [address], [place], [place] [address] [phone]

## 2017-04-27 ENCOUNTER — Encounter: Payer: Self-pay | Admitting: Nurse Practitioner

## 2017-04-27 ENCOUNTER — Ambulatory Visit (INDEPENDENT_AMBULATORY_CARE_PROVIDER_SITE_OTHER): Payer: BLUE CROSS/BLUE SHIELD | Admitting: Nurse Practitioner

## 2017-04-27 VITALS — BP 118/71 | HR 66 | Ht 68.25 in | Wt 170.2 lb

## 2017-04-27 DIAGNOSIS — G40309 Generalized idiopathic epilepsy and epileptic syndromes, not intractable, without status epilepticus: Secondary | ICD-10-CM

## 2017-04-27 MED ORDER — EPITOL 200 MG PO TABS: 200.0000 mg | ORAL_TABLET | Freq: Three times a day (TID) | ORAL | 11 refills | Status: DC

## 2017-04-27 NOTE — Patient Instructions (Signed)
Continue Epitol at current dose will refill for 1 year Labs today ,CBC, CMP and CBZ F/U yearly and prn Call for seizure activity 

## 2017-04-28 ENCOUNTER — Telehealth: Payer: Self-pay | Admitting: *Deleted

## 2017-04-28 LAB — CBC WITH DIFFERENTIAL/PLATELET
Basophils Absolute: 0.1 10*3/uL (ref 0.0–0.2)
Basos: 1 %
EOS (ABSOLUTE): 0.2 10*3/uL (ref 0.0–0.4)
Eos: 4 %
HEMATOCRIT: 43.5 % (ref 34.0–46.6)
Hemoglobin: 14.7 g/dL (ref 11.1–15.9)
IMMATURE GRANS (ABS): 0 10*3/uL (ref 0.0–0.1)
IMMATURE GRANULOCYTES: 0 %
Lymphocytes Absolute: 1.7 10*3/uL (ref 0.7–3.1)
Lymphs: 30 %
MCH: 30.1 pg (ref 26.6–33.0)
MCHC: 33.8 g/dL (ref 31.5–35.7)
MCV: 89 fL (ref 79–97)
MONOCYTES: 9 %
MONOS ABS: 0.5 10*3/uL (ref 0.1–0.9)
NEUTROS ABS: 3.3 10*3/uL (ref 1.4–7.0)
Neutrophils: 56 %
Platelets: 362 10*3/uL (ref 150–379)
RBC: 4.88 x10E6/uL (ref 3.77–5.28)
RDW: 14 % (ref 12.3–15.4)
WBC: 5.9 10*3/uL (ref 3.4–10.8)

## 2017-04-28 LAB — COMPREHENSIVE METABOLIC PANEL
A/G RATIO: 1.9 (ref 1.2–2.2)
ALBUMIN: 4.5 g/dL (ref 3.6–4.8)
ALT: 14 IU/L (ref 0–32)
AST: 14 IU/L (ref 0–40)
Alkaline Phosphatase: 139 IU/L — ABNORMAL HIGH (ref 39–117)
BUN / CREAT RATIO: 20 (ref 12–28)
BUN: 11 mg/dL (ref 8–27)
CHLORIDE: 100 mmol/L (ref 96–106)
CO2: 23 mmol/L (ref 20–29)
Calcium: 9.2 mg/dL (ref 8.7–10.3)
Creatinine, Ser: 0.54 mg/dL — ABNORMAL LOW (ref 0.57–1.00)
GFR calc non Af Amer: 101 mL/min/{1.73_m2} (ref >59)
GFR, EST AFRICAN AMERICAN: 116 mL/min/{1.73_m2} (ref >59)
GLOBULIN, TOTAL: 2.4 g/dL (ref 1.5–4.5)
Glucose: 104 mg/dL — ABNORMAL HIGH (ref 65–99)
POTASSIUM: 4.5 mmol/L (ref 3.5–5.2)
Sodium: 141 mmol/L (ref 134–144)
TOTAL PROTEIN: 6.9 g/dL (ref 6.0–8.5)

## 2017-04-28 LAB — CARBAMAZEPINE LEVEL, TOTAL: Carbamazepine (Tegretol), S: 5.5 ug/mL (ref 4.0–12.0)

## 2017-04-28 NOTE — Telephone Encounter (Signed)
-----   Message from Dennie Bible, NP sent at 04/28/2017 11:54 AM EDT ----- Labs look good please call the patient

## 2017-04-28 NOTE — Telephone Encounter (Signed)
Spoke to pt and relayed that her lab results looked good.  She verbalized understanding,

## 2017-04-28 NOTE — Telephone Encounter (Signed)
LMVM for pt that lab results looked good.  She verbalized understanding.

## 2017-06-08 ENCOUNTER — Ambulatory Visit: Payer: BLUE CROSS/BLUE SHIELD | Admitting: Obstetrics and Gynecology

## 2017-06-29 NOTE — Progress Notes (Deleted)
61 y.o. G84P6006 Married Caucasian female here for annual exam.    PCP:     Patient's last menstrual period was 09/08/2006.           Sexually active: {yes no:314532}  The current method of family planning is status post hysterectomy.    Exercising: {yes no:314532}  {types:19826} Smoker:  no  Health Maintenance: Pap:   11-08-12 neg HPV HR neg.  Cervix at hysterectomy - benign.  History of abnormal Pap:  no MMG: 11-19-15 Density B/Neg/BiRads1:TBC Colonoscopy:  ***?12/2015 normal;next due 12/2025 BMD: Years ago Result :normal with Neurology due to anti-seizure medicine. TDaP:  03/2015 Gardasil:   no HIV:*** Hep C:*** Screening Labs:  Hb today: ***, Urine today: ***   reports that she has never smoked. She has never used smokeless tobacco. She reports that she does not drink alcohol or use drugs.  Past Medical History:  Diagnosis Date  . Seizure ([place]) 2007   frontal lobe, only one seizure in 2007, none since  . SVD (spontaneous vaginal delivery)    x 6    Past Surgical History:  Procedure Laterality Date  . ABDOMINAL HYSTERECTOMY N/A 03/04/2016   Procedure: HYSTERECTOMY ABDOMINAL;  Surgeon: Nunzio Cobbs, MD;  Location: [facility];  Service: Gynecology;  Laterality: N/A;  ANESTHESIA CONSULT NEEDED  . ABDOMINAL SACROCOLPOPEXY N/A 03/04/2016   Procedure: ABDOMINO SACROCOLPOPEXY with HALBAN'S CULDOPLASTY;  Surgeon: Nunzio Cobbs, MD;  Location: [facility];  Service: Gynecology;  Laterality: N/A;  . ANTERIOR AND POSTERIOR REPAIR N/A 03/04/2016   Procedure: ANTERIOR (CYSTOCELE) AND POSTERIOR REPAIR (RECTOCELE);  Surgeon: Nunzio Cobbs, MD;  Location: [facility];  Service: Gynecology;  Laterality: N/A;  . BILATERAL SALPINGECTOMY  03/04/2016   Procedure: BILATERAL SALPINGECTOMY;  Surgeon: Nunzio Cobbs, MD;  Location: [facility];  Service: Gynecology;;  . BLADDER SUSPENSION N/A 03/04/2016   Procedure: TRANSVAGINAL TAPE (TVT) PROCEDURE exact midurethral sling;   Surgeon: Nunzio Cobbs, MD;  Location: [facility];  Service: Gynecology;  Laterality: N/A;  . COLONOSCOPY     normal  . CYSTO N/A 03/04/2016   Procedure: CYSTO;  Surgeon: Nunzio Cobbs, MD;  Location: [facility];  Service: Gynecology;  Laterality: N/A;  . DIAGNOSTIC LAPAROSCOPY Left 1986   LSO for dermoid cyst  . OOPHORECTOMY Left 1986   LSO-Dermoid  . OOPHORECTOMY  03/04/2016   Procedure: RIGHT OOPHORECTOMY;  Surgeon: Nunzio Cobbs, MD;  Location: [facility];  Service: Gynecology;;  . TONSILLECTOMY     as a child    Current Outpatient Prescriptions  Medication Sig Dispense Refill  . aspirin 81 MG chewable tablet Chew 81 mg by mouth daily.    . Cholecalciferol (VITAMIN D3) 5000 units CAPS Take 2 capsules by mouth 2 (two) times daily.    Marland Kitchen conjugated estrogens (PREMARIN) vaginal cream Use 1/2 g vaginally every night at bed time for the first 2 weeks, then use 1/2 g vaginally two or three times per week. 30 g 2  . cyanocobalamin 1000 MCG tablet Take 1,000 mcg by mouth daily.    Marland Kitchen docusate sodium (COLACE) 100 MG capsule Take 100 mg by mouth daily.    . EPITOL 200 MG tablet Take 1 tablet (200 mg total) by mouth 3 (three) times daily. 90 tablet 11  . polyethylene glycol (MIRALAX / GLYCOLAX) packet Take 17 g by mouth daily. 1/2 cap po daily     No current facility-administered  medications for this visit.     Family History  Problem Relation Age of Onset  . Osteoarthritis Mother   . Glaucoma Father 95  . Cancer Maternal Grandfather   . Spina bifida Brother 0       lived 4 months  . Diabetes Paternal Grandmother     ROS:  Pertinent items are noted in HPI.  Otherwise, a comprehensive ROS was negative.  Exam:   LMP 09/08/2006     General appearance: alert, cooperative and appears stated age Head: Normocephalic, without obvious abnormality, atraumatic Neck: no adenopathy, supple, symmetrical, trachea midline and thyroid normal to inspection and palpation Lungs:  clear to auscultation bilaterally Breasts: normal appearance, no masses or tenderness, No nipple retraction or dimpling, No nipple discharge or bleeding, No axillary or supraclavicular adenopathy Heart: regular rate and rhythm Abdomen: soft, non-tender; no masses, no organomegaly Extremities: extremities normal, atraumatic, no cyanosis or edema Skin: Skin color, texture, turgor normal. No rashes or lesions Lymph nodes: Cervical, supraclavicular, and axillary nodes normal. No abnormal inguinal nodes palpated Neurologic: Grossly normal  Pelvic: External genitalia:  no lesions              Urethra:  normal appearing urethra with no masses, tenderness or lesions              Bartholins and Skenes: normal                 Vagina: normal appearing vagina with normal color and discharge, no lesions              Cervix: no lesions              Pap taken: {yes no:314532} Bimanual Exam:  Uterus:  normal size, contour, position, consistency, mobility, non-tender              Adnexa: no mass, fullness, tenderness              Rectal exam: {yes no:314532}.  Confirms.              Anus:  normal sphincter tone, no lesions  Chaperone was present for exam.  Assessment:   Well woman visit with normal exam.   Plan: Mammogram screening discussed. Recommended self breast awareness. Pap and HR HPV as above. Guidelines for Calcium, Vitamin D, regular exercise program including cardiovascular and weight bearing exercise.   Follow up annually and prn.   Additional counseling given.  {yes [id number]. _______ minutes face to face time of which over 50% was spent in counseling.    After visit summary provided.

## 2017-07-01 ENCOUNTER — Ambulatory Visit (INDEPENDENT_AMBULATORY_CARE_PROVIDER_SITE_OTHER): Payer: BLUE CROSS/BLUE SHIELD | Admitting: Obstetrics and Gynecology

## 2017-07-20 NOTE — Progress Notes (Signed)
61 y.o. G32P6006 Married Caucasian female here for annual exam.    Not using Premarin cream.   Lost her son to a pulmonary embolus beginning of 2018. Had lupus anticoagulant.  Patient is really struggling with the loss.  In counseling but feeling herself getting worse emotionally.  Not suicidal. Separation growing between her and her husband.   Had labs with PCP. 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  She was negative for lupus anticoagulant.  PCP:   [facility].  Patient's last menstrual period was 09/08/2006.           Sexually active: Yes.    The current method of family planning is status post hysterectomy and post menopausal status.    Exercising: Yes.    walking Smoker:  no  Health Maintenance: Pap: 11-08-12 Neg:Neg HR HPV. Cervix at Hysterectomy - benign History of abnormal Pap:  no MMG: 11-19-15 Density B/Neg/BiRads1:TBC Colonoscopy:  4/17 f/u 10 yrs.    BMD:   Years ago per patient  Result  normal TDaP: 03/2015 Gardasil:   no HIV: never Hep C: never Screening Labs: Neurologist   reports that  has never smoked. she has never used smokeless tobacco. She reports that she does not drink alcohol or use drugs.  Past Medical History:  Diagnosis Date  . Seizure ([place]) 2007   frontal lobe, only one seizure in 2007, none since  . SVD (spontaneous vaginal delivery)    x 6    Past Surgical History:  Procedure Laterality Date  . COLONOSCOPY     normal  . DIAGNOSTIC LAPAROSCOPY Left 1986   LSO for dermoid cyst  . OOPHORECTOMY Left 1986   LSO-Dermoid  . TONSILLECTOMY     as a child    Current Outpatient Medications  Medication Sig Dispense Refill  . aspirin 81 MG chewable tablet Chew 81 mg by mouth daily.    . Cholecalciferol (VITAMIN D3) 5000 units CAPS Take 2 capsules by mouth 2 (two) times daily.    Marland Kitchen conjugated estrogens (PREMARIN) vaginal cream Use 1/2 g vaginally every night at bed time for the first 2 weeks, then use 1/2 g vaginally two or three times per  week. 30 g 2  . cyanocobalamin 1000 MCG tablet Take 1,000 mcg by mouth daily.    Marland Kitchen docusate sodium (COLACE) 100 MG capsule Take 100 mg by mouth daily.    . EPITOL 200 MG tablet Take 1 tablet (200 mg total) by mouth 3 (three) times daily. 90 tablet 11  . gabapentin (NEURONTIN) 100 MG capsule Take 100 mg at bedtime by mouth.  0  . polyethylene glycol (MIRALAX / GLYCOLAX) packet Take 17 g by mouth daily. 1/2 cap po daily    . predniSONE (DELTASONE) 10 MG tablet Day1-4Take2  before brkfst,1 after lunch&dinner & 2 at bedtime. Day5-8Take1 before brkfst,after lunch & dinner & at bedtime. [id number] Take  0   No current facility-administered medications for this visit.     Family History  Problem Relation Age of Onset  . Osteoarthritis Mother   . Glaucoma Father 59  . Spina bifida Brother   . Cancer Maternal Grandfather   . Diabetes Paternal Grandmother     ROS:  Pertinent items are noted in HPI.  Otherwise, a comprehensive ROS was negative.  Exam:   BP 120/78 (BP Location: Right Arm, Patient Position: Sitting, Cuff Size: Normal)   Pulse 76   Resp 16   Ht 5\' 9"  (1.753 m)   Wt 170 lb (77.1  kg)   LMP 09/08/2006   BMI 25.10 kg/m     General appearance: alert, cooperative and appears stated age Head: Normocephalic, without obvious abnormality, atraumatic Neck: no adenopathy, supple, symmetrical, trachea midline and thyroid normal to inspection and palpation Lungs: clear to auscultation bilaterally Breasts: normal appearance, no masses or tenderness, No nipple retraction or dimpling, No nipple discharge or bleeding, No axillary or supraclavicular adenopathy Heart: regular rate and rhythm Abdomen: soft, non-tender; no masses, no organomegaly Extremities: extremities normal, atraumatic, no cyanosis or edema Skin: Skin color, texture, turgor normal. No rashes or lesions Lymph nodes: Cervical, supraclavicular, and axillary nodes normal. No abnormal inguinal nodes palpated Neurologic: Grossly  normal  Pelvic: External genitalia:  no lesions              Urethra:  normal appearing urethra with no masses, tenderness or lesions              Bartholins and Skenes: normal                 Vagina: normal appearing vagina with normal color and discharge, no lesions              Cervix:  Absent.  No sling or mesh exposure or erosion.              Pap taken: No. Bimanual Exam:  Uterus: absent.  Sacrocolpopexy mesh present under the mucosa in normal position.              Adnexa: no mass, fullness, tenderness              Rectal exam: Yes.  .  Confirms.              Anus:  normal sphincter tone, no lesions  Chaperone was present for exam.  Assessment:   Well woman visit with normal exam. Status post TAH/BSO/abdominal sacrocolpopexy/anterior and posterior colporrhaphy/TVT/cysto. Bereavement.   Plan: Mammogram screening discussed.  We will schedule for her. Recommended self breast awareness. Pap and HR HPV as above. Guidelines for Calcium, Vitamin D, regular exercise program including cardiovascular and weight bearing exercise. Start Zoloft 50 mg daily.  Discussed side effects including serotonin syndrome. Follow up in 6 weeks. Follow up annually and prn.   After visit summary provided.

## 2017-07-22 ENCOUNTER — Encounter: Payer: Self-pay | Admitting: Obstetrics and Gynecology

## 2017-07-22 ENCOUNTER — Other Ambulatory Visit: Payer: Self-pay

## 2017-07-22 ENCOUNTER — Other Ambulatory Visit: Payer: Self-pay | Admitting: Obstetrics and Gynecology

## 2017-07-22 ENCOUNTER — Ambulatory Visit (INDEPENDENT_AMBULATORY_CARE_PROVIDER_SITE_OTHER): Payer: BLUE CROSS/BLUE SHIELD | Admitting: Obstetrics and Gynecology

## 2017-07-22 VITALS — BP 120/78 | HR 76 | Resp 16 | Ht 69.0 in | Wt 170.0 lb

## 2017-07-22 DIAGNOSIS — Z634 Disappearance and death of family member: Secondary | ICD-10-CM

## 2017-07-22 DIAGNOSIS — Z01419 Encounter for gynecological examination (general) (routine) without abnormal findings: Secondary | ICD-10-CM | POA: Diagnosis not present

## 2017-07-22 DIAGNOSIS — Z139 Encounter for screening, unspecified: Secondary | ICD-10-CM

## 2017-07-22 MED ORDER — SERTRALINE HCL 50 MG PO TABS
50.0000 mg | ORAL_TABLET | Freq: Every day | ORAL | 1 refills | Status: DC
Start: 2017-07-22 — End: 2017-08-21

## 2017-07-22 NOTE — Patient Instructions (Signed)
EXERCISE AND DIET:  We recommended that you start or continue a regular exercise program for good health. Regular exercise means any activity that makes your heart beat faster and makes you sweat.  We recommend exercising at least 30 minutes per day at least 3 days a week, preferably 4 or 5.  We also recommend a diet low in fat and sugar.  Inactivity, poor dietary choices and obesity can cause diabetes, heart attack, stroke, and kidney damage, among others.    ALCOHOL AND SMOKING:  Women should limit their alcohol intake to no more than 7 drinks/beers/glasses of wine (combined, not each!) per week. Moderation of alcohol intake to this level decreases your risk of breast cancer and liver damage. And of course, no recreational drugs are part of a healthy lifestyle.  And absolutely no smoking or even second hand smoke. Most people know smoking can cause heart and lung diseases, but did you know it also contributes to weakening of your bones? Aging of your skin?  Yellowing of your teeth and nails?  CALCIUM AND VITAMIN D:  Adequate intake of calcium and Vitamin D are recommended.  The recommendations for exact amounts of these supplements seem to change often, but generally speaking 600 mg of calcium (either carbonate or citrate) and 800 units of Vitamin D per day seems prudent. Certain women may benefit from higher intake of Vitamin D.  If you are among these women, your doctor will have told you during your visit.    PAP SMEARS:  Pap smears, to check for cervical cancer or precancers,  have traditionally been done yearly, although recent scientific advances have shown that most women can have pap smears less often.  However, every woman still should have a physical exam from her gynecologist every year. It will include a breast check, inspection of the vulva and vagina to check for abnormal growths or skin changes, a visual exam of the cervix, and then an exam to evaluate the size and shape of the uterus and  ovaries.  And after 63 years of age, a rectal exam is indicated to check for rectal cancers. We will also provide age appropriate advice regarding health maintenance, like when you should have certain vaccines, screening for sexually transmitted diseases, bone density testing, colonoscopy, mammograms, etc.   MAMMOGRAMS:  All women over 40 years old should have a yearly mammogram. Many facilities now offer a "3D" mammogram, which may cost around $50 extra out of pocket. If possible,  we recommend you accept the option to have the 3D mammogram performed.  It both reduces the number of women who will be called back for extra views which then turn out to be normal, and it is better than the routine mammogram at detecting truly abnormal areas.    COLONOSCOPY:  Colonoscopy to screen for colon cancer is recommended for all women at age 50.  We know, you hate the idea of the prep.  We agree, BUT, having colon cancer and not knowing it is worse!!  Colon cancer so often starts as a polyp that can be seen and removed at colonscopy, which can quite literally save your life!  And if your first colonoscopy is normal and you have no family history of colon cancer, most women don't have to have it again for 10 years.  Once every ten years, you can do something that may end up saving your life, right?  We will be happy to help you get it scheduled when you are ready.    Be sure to check your insurance coverage so you understand how much it will cost.  It may be covered as a preventative service at no cost, but you should check your particular policy.     Sertraline tablets What is this medicine? SERTRALINE (SER tra leen) is used to treat depression. It may also be used to treat obsessive compulsive disorder, panic disorder, post-trauma stress, premenstrual dysphoric disorder (PMDD) or social anxiety. This medicine may be used for other purposes; ask your health care provider or pharmacist if you have questions. COMMON BRAND  NAME(S): Zoloft What should I tell my health care provider before I take this medicine? They need to know if you have any of these conditions: -bleeding disorders -bipolar disorder or a family history of bipolar disorder -glaucoma -heart disease -high blood pressure -history of irregular heartbeat -history of low levels of calcium, magnesium, or potassium in the blood -if you often drink alcohol -liver disease -receiving electroconvulsive therapy -seizures -suicidal thoughts, plans, or attempt; a previous suicide attempt by you or a family member -take medicines that treat or prevent blood clots -thyroid disease -an unusual or allergic reaction to sertraline, other medicines, foods, dyes, or preservatives -pregnant or trying to get pregnant -breast-feeding How should I use this medicine? Take this medicine by mouth with a glass of water. Follow the directions on the prescription label. You can take it with or without food. Take your medicine at regular intervals. Do not take your medicine more often than directed. Do not stop taking this medicine suddenly except upon the advice of your doctor. Stopping this medicine too quickly may cause serious side effects or your condition may worsen. A special MedGuide will be given to you by the pharmacist with each prescription and refill. Be sure to read this information carefully each time. Talk to your pediatrician regarding the use of this medicine in children. While this drug may be prescribed for children as young as 7 years for selected conditions, precautions do apply. Overdosage: If you think you have taken too much of this medicine contact a poison control center or emergency room at once. NOTE: This medicine is only for you. Do not share this medicine with others. What if I miss a dose? If you miss a dose, take it as soon as you can. If it is almost time for your next dose, take only that dose. Do not take double or extra doses. What may  interact with this medicine? Do not take this medicine with any of the following medications: -cisapride -dofetilide -dronedarone -linezolid -MAOIs like Carbex, Eldepryl, Marplan, Nardil, and Parnate -methylene blue (injected into a vein) -pimozide -thioridazine This medicine may also interact with the following medications: -alcohol -amphetamines -aspirin and aspirin-like medicines -certain medicines for depression, anxiety, or psychotic disturbances -certain medicines for fungal infections like ketoconazole, fluconazole, posaconazole, and itraconazole -certain medicines for irregular heart beat like flecainide, quinidine, propafenone -certain medicines for migraine headaches like almotriptan, eletriptan, frovatriptan, naratriptan, rizatriptan, sumatriptan, zolmitriptan -certain medicines for sleep -certain medicines for seizures like carbamazepine, valproic acid, phenytoin -certain medicines that treat or prevent blood clots like warfarin, enoxaparin, dalteparin -cimetidine -digoxin -diuretics -fentanyl -isoniazid -lithium -NSAIDs, medicines for pain and inflammation, like ibuprofen or naproxen -other medicines that prolong the QT interval (cause an abnormal heart rhythm) -rasagiline -safinamide -supplements like St. John's wort, kava kava, valerian -tolbutamide -tramadol -tryptophan This list may not describe all possible interactions. Give your health care provider a list of all the medicines, herbs, non-prescription drugs, or  dietary supplements you use. Also tell them if you smoke, drink alcohol, or use illegal drugs. Some items may interact with your medicine. What should I watch for while using this medicine? Tell your doctor if your symptoms do not get better or if they get worse. Visit your doctor or health care professional for regular checks on your progress. Because it may take several weeks to see the full effects of this medicine, it is important to continue your  treatment as prescribed by your doctor. Patients and their families should watch out for new or worsening thoughts of suicide or depression. Also watch out for sudden changes in feelings such as feeling anxious, agitated, panicky, irritable, hostile, aggressive, impulsive, severely restless, overly excited and hyperactive, or not being able to sleep. If this happens, especially at the beginning of treatment or after a change in dose, call your health care professional. Dennis Bast may get drowsy or dizzy. Do not drive, use machinery, or do anything that needs mental alertness until you know how this medicine affects you. Do not stand or sit up quickly, especially if you are an older patient. This reduces the risk of dizzy or fainting spells. Alcohol may interfere with the effect of this medicine. Avoid alcoholic drinks. Your mouth may get dry. Chewing sugarless gum or sucking hard candy, and drinking plenty of water may help. Contact your doctor if the problem does not go away or is severe. What side effects may I notice from receiving this medicine? Side effects that you should report to your doctor or health care professional as soon as possible: -allergic reactions like skin rash, itching or hives, swelling of the face, lips, or tongue -anxious -black, tarry stools -changes in vision -confusion -elevated mood, decreased need for sleep, racing thoughts, impulsive behavior -eye pain -fast, irregular heartbeat -feeling faint or lightheaded, falls -feeling agitated, angry, or irritable -hallucination, loss of contact with reality -loss of balance or coordination -loss of memory -painful or prolonged erections -restlessness, pacing, inability to keep still -seizures -stiff muscles -suicidal thoughts or other mood changes -trouble sleeping -unusual bleeding or bruising -unusually weak or tired -vomiting Side effects that usually do not require medical attention (report to your doctor or health care  professional if they continue or are bothersome): -change in appetite or weight -change in sex drive or performance -diarrhea -increased sweating -indigestion, nausea -tremors This list may not describe all possible side effects. Call your doctor for medical advice about side effects. You may report side effects to FDA at 1-800-FDA-1088. Where should I keep my medicine? Keep out of the reach of children. Store at room temperature between 15 and 30 degrees C (59 and 86 degrees F). Throw away any unused medicine after the expiration date. NOTE: This sheet is a summary. It may not cover all possible information. If you have questions about this medicine, talk to your doctor, pharmacist, or health care provider.  2018 Elsevier/Gold Standard (2016-08-29 14:17:49)

## 2017-08-19 ENCOUNTER — Ambulatory Visit: Payer: BLUE CROSS/BLUE SHIELD

## 2017-08-21 ENCOUNTER — Other Ambulatory Visit: Payer: Self-pay | Admitting: Obstetrics and Gynecology

## 2017-08-21 NOTE — Telephone Encounter (Signed)
Please let pt know refill has been done but I just wanted to check and see how she is doing and if she needs the dosage increased.  Please reschedule the appt with Dr. Quincy Simmonds if pt is ok with this.  If pt needs to be seen earlier, Dr. Talbert Nan or I will be happy to see her.  Thanks.

## 2017-08-21 NOTE — Telephone Encounter (Signed)
Medication refill request: Zoloft  Last AEX:  07-22-17  Next AEX: 08-11-18  Last MMG (if hormonal medication request): 11-19-15 WNL  Refill authorized: please advise

## 2017-08-24 NOTE — Telephone Encounter (Signed)
Spoke with patient to see how she was doing. She states she is doing okay. Has good days and bad days. She doesn't feel she needs to increase Zoloft. Advised to keep follow up with Dr.Silva and to call if she needed anything prior to appointment.   Please advise on RX--routed to Dr.Miller

## 2017-08-27 ENCOUNTER — Telehealth: Payer: Self-pay

## 2017-08-27 NOTE — Telephone Encounter (Signed)
Called patient to reschedule post op appointment on 09-04-17, lmovm to return my call.

## 2017-09-03 NOTE — Telephone Encounter (Signed)
Called and spoke with patient re: Dr. Quincy Simmonds will not be in the office on 09/07/17 for her 6 week recheck. Patient reported she is doing fine and prefers to wait until Dr. Quincy Simmonds returns to reschedule the appointment. Will keep encounter open to monitor rescheduling.  Routing to [place] for [place] only.

## 2017-09-07 ENCOUNTER — Ambulatory Visit: Payer: BLUE CROSS/BLUE SHIELD | Admitting: Obstetrics and Gynecology

## 2017-10-05 NOTE — Telephone Encounter (Signed)
Called patient to reschedule appointment with Dr.Silva, left message to call me back. (appt.available 1/31 at 11 and 11:30)

## 2017-10-05 NOTE — Telephone Encounter (Signed)
Patient spoke with front desk and appointment was made for 10-21-17.

## 2017-10-21 ENCOUNTER — Other Ambulatory Visit: Payer: Self-pay

## 2017-10-21 ENCOUNTER — Encounter: Payer: Self-pay | Admitting: Obstetrics and Gynecology

## 2017-10-21 ENCOUNTER — Ambulatory Visit: Payer: BLUE CROSS/BLUE SHIELD | Admitting: Obstetrics and Gynecology

## 2017-10-21 VITALS — BP 118/68 | HR 68 | Resp 16 | Wt 170.0 lb

## 2017-10-21 DIAGNOSIS — Z634 Disappearance and death of family member: Secondary | ICD-10-CM | POA: Diagnosis not present

## 2017-10-21 DIAGNOSIS — Z0000 Encounter for general adult medical examination without abnormal findings: Secondary | ICD-10-CM

## 2017-10-21 MED ORDER — SERTRALINE HCL 50 MG PO TABS: 50.0000 mg | ORAL_TABLET | Freq: Every day | ORAL | 2 refills | Status: DC

## 2017-10-21 NOTE — Patient Instructions (Signed)
Please increase your Zoloft back up to 50 mg daily.

## 2017-10-21 NOTE — Progress Notes (Signed)
GYNECOLOGY  VISIT   HPI: 62 y.o.   Married  Caucasian  female   [phone] with Patient's last menstrual period was 09/08/2006.   here for  6 week med recheck.  Started Zoloft for depression related to death of son.   Started with 50 mg daily and she has cut it in half for 3 - 4 weeks. Was afraid she could not cry anymore, but she states she can.  Thinks she is getting better. Had the one year anniversary of his death at the end of Nov 04, 2016.   Doing counseling.   Eating, sleeping, being social with others.   Has important dates coming up.   Her birthday and her son's birthday.   GYNECOLOGIC HISTORY: Patient's last menstrual period was 09/08/2006. Contraception: Hysterectomy  Menopausal hormone therapy:  None -- patient not taking Premarin Last mammogram:  11/19/15 BIRADS 1 negative/density b Last pap smear:   11-08-12 Neg:Neg HR HPV. Cervix at Hysterectomy - benign        OB History    Gravida Para Term Preterm AB Living   6 6 6     5    SAB TAB Ectopic Multiple Live Births                     Patient Active Problem List   Diagnosis Date Noted  . 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 Prolapse of uterus 12/29/2012  . Cystocele 12/29/2012  . Seizure [facility])     Past Medical History:  Diagnosis Date  . Seizure ([place]) 2007   frontal lobe, only one seizure in 2007, none since  . SVD (spontaneous vaginal delivery)    x 6    Past Surgical History:  Procedure Laterality Date  . ABDOMINAL HYSTERECTOMY N/A 03/04/2016   Procedure: HYSTERECTOMY ABDOMINAL;  Surgeon: Nunzio Cobbs, MD;  Location: [facility];  Service: Gynecology;  Laterality: N/A;  ANESTHESIA CONSULT NEEDED  . ABDOMINAL SACROCOLPOPEXY N/A 03/04/2016   Procedure: ABDOMINO SACROCOLPOPEXY with HALBAN'S CULDOPLASTY;  Surgeon: Nunzio Cobbs, MD;  Location: [facility];  Service: Gynecology;  Laterality: N/A;  . ANTERIOR AND POSTERIOR  REPAIR N/A 03/04/2016   Procedure: ANTERIOR (CYSTOCELE) AND POSTERIOR REPAIR (RECTOCELE);  Surgeon: Nunzio Cobbs, MD;  Location: [facility];  Service: Gynecology;  Laterality: N/A;  . BILATERAL SALPINGECTOMY  03/04/2016   Procedure: BILATERAL SALPINGECTOMY;  Surgeon: Nunzio Cobbs, MD;  Location: [facility];  Service: Gynecology;;  . BLADDER SUSPENSION N/A 03/04/2016   Procedure: TRANSVAGINAL TAPE (TVT) PROCEDURE exact midurethral sling;  Surgeon: Nunzio Cobbs, MD;  Location: [facility];  Service: Gynecology;  Laterality: N/A;  . COLONOSCOPY     normal  . CYSTO N/A 03/04/2016   Procedure: CYSTO;  Surgeon: Nunzio Cobbs, MD;  Location: [facility];  Service: Gynecology;  Laterality: N/A;  . DIAGNOSTIC LAPAROSCOPY Left 1986   LSO for dermoid cyst  . OOPHORECTOMY Left 1986   LSO-Dermoid  . OOPHORECTOMY  03/04/2016   Procedure: RIGHT OOPHORECTOMY;  Surgeon: Nunzio Cobbs, MD;  Location: [facility];  Service: Gynecology;;  . TONSILLECTOMY     as a child    Current Outpatient Medications  Medication Sig Dispense Refill  . aspirin 81 MG chewable tablet Chew 81 mg by mouth daily.    . Cholecalciferol (VITAMIN D3) 5000 units CAPS Take 2 capsules by mouth 2 (two)  times daily.    . cyanocobalamin 1000 MCG tablet Take 1,000 mcg by mouth daily.    Mariane Baumgarten Calcium (STOOL SOFTENER PO) Take by mouth daily.    Marland Kitchen docusate sodium (COLACE) 100 MG capsule Take 100 mg by mouth daily.    . EPITOL 200 MG tablet Take 1 tablet (200 mg total) by mouth 3 (three) times daily. 90 tablet 11  . polyethylene glycol (MIRALAX / GLYCOLAX) packet Take 17 g by mouth daily. 1/2 cap po daily    . sertraline (ZOLOFT) 50 MG tablet Take 1 tablet (50 mg total) by mouth daily. 90 tablet 2  . conjugated estrogens (PREMARIN) vaginal cream Use 1/2 g vaginally every night at bed time for the first 2 weeks, then use 1/2 g vaginally two or three times per week. (Patient not taking: Reported on  10/21/2017) 30 g 2  . gabapentin (NEURONTIN) 100 MG capsule Take 100 mg at bedtime by mouth.  0   No current facility-administered medications for this visit.      ALLERGIES: Patient has no known allergies.  Family History  Problem Relation Age of Onset  . Osteoarthritis Mother   . Glaucoma Father 58  . Spina bifida Brother   . Cancer Maternal Grandfather   . Diabetes Paternal Grandmother     Social History   Socioeconomic History  . Marital status: Married    Spouse name: Not on file  . Number of children: 6  . Years of education: 12th  . Highest education level: Not on file  Social Needs  . Financial resource strain: Not on file  . Food insecurity - worry: Not on file  . Food insecurity - inability: Not on file  . Transportation needs - medical: Not on file  . Transportation needs - non-medical: Not on file  Occupational History  . Occupation: homemaker  Tobacco Use  . Smoking status: Never Smoker  . Smokeless tobacco: Never Used  Substance and Sexual Activity  . Alcohol use: No    Alcohol/week: 0.0 oz  . Drug use: No  . Sexual activity: Yes    Partners: Male    Birth control/protection: Post-menopausal, Surgical    Comment: TAH/Bil.salpingectomy/RSO  Other Topics Concern  . Not on file  Social History Narrative   Patient lives at home with her husband and has 6 children. 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     ROS:  Pertinent items are noted in HPI.  PHYSICAL EXAMINATION:    BP 118/68 (BP Location: Right Arm, Patient Position: Sitting, Cuff Size: Normal)   Pulse 68   Resp 16   Wt 170 lb (77.1 kg)   LMP 09/08/2006   BMI 25.10 kg/m     General appearance: alert, cooperative and appears stated age   ASSESSMENT  Bereavement.  On Zoloft.   PLAN  Support given.  Continue counseling.  Increase Zoloft back up to 50 mg daily. I think she prematurely decreased her medication.  Recheck in about 15 weeks. Wants to wait until after her  daughter's wedding to this next recheck. Annual exam is due in November 2019.   An After Visit Summary was printed and given to the patient.  _15_____ minutes face to face time of which over 50% was spent in counseling.

## 2018-02-12 ENCOUNTER — Telehealth: Payer: Self-pay | Admitting: Obstetrics and Gynecology

## 2018-02-12 NOTE — Telephone Encounter (Signed)
Left message to call Genifer Lazenby at [phone]. 

## 2018-02-12 NOTE — Telephone Encounter (Signed)
Patient cancelled 16 week recheck on 02/15/18 and would like to know if she still needs to be seen. Will reschedule if she needs to.

## 2018-02-15 ENCOUNTER — Ambulatory Visit: Payer: BLUE CROSS/BLUE SHIELD | Admitting: Obstetrics and Gynecology

## 2018-02-19 NOTE — Telephone Encounter (Signed)
If she is doing well, she does not need to return until her annual exam.

## 2018-02-19 NOTE — Telephone Encounter (Signed)
Spoke with patient. Patient states she increased her Zoloft back to 50 mg daily as recommended by Dr.Silva at her visit on 10/21/17. Has been doing well. Wishes to continue current dose. Has refills until her aex in 08/2018. Asking if she needs to return for a recheck before her annual.

## 2018-02-22 NOTE — Telephone Encounter (Signed)
Advised patient of message as seen below from [place]. Patient is agreeable. Encounter closed.

## 2018-05-03 ENCOUNTER — Ambulatory Visit: Payer: BLUE CROSS/BLUE SHIELD | Admitting: Nurse Practitioner

## 2018-05-20 ENCOUNTER — Other Ambulatory Visit: Payer: Self-pay | Admitting: Obstetrics and Gynecology

## 2018-06-09 ENCOUNTER — Other Ambulatory Visit: Payer: Self-pay | Admitting: Nurse Practitioner

## 2018-06-09 MED ORDER — EPITOL 200 MG PO TABS: 200.0000 mg | ORAL_TABLET | Freq: Three times a day (TID) | ORAL | 2 refills | Status: DC

## 2018-06-09 NOTE — Telephone Encounter (Signed)
Refilled x 3 months with note to pharmacy, keep FU in Dec.

## 2018-06-09 NOTE — Telephone Encounter (Signed)
Pt requesting refills for EPITOL 200 MG tablet sent to [facility] to last until upcoming appt in december

## 2018-08-11 ENCOUNTER — Ambulatory Visit: Payer: BLUE CROSS/BLUE SHIELD | Admitting: Obstetrics and Gynecology

## 2018-08-24 ENCOUNTER — Ambulatory Visit: Payer: BLUE CROSS/BLUE SHIELD | Admitting: Nurse Practitioner

## 2018-09-09 ENCOUNTER — Other Ambulatory Visit: Payer: Self-pay | Admitting: Obstetrics and Gynecology

## 2018-09-09 NOTE — Telephone Encounter (Signed)
Med refill request: Zoloft 50mg  tab PO daily Last AEX: 07/22/17 Next AEX: 11/22/18 Last MMG: 11/19/15 Refill authorized: Please Advise?  Last filled 10/21/17 #[id number]/2RF

## 2018-09-09 NOTE — Telephone Encounter (Signed)
Patient is calling regarding a refill for Zoloft. Patient is requesting a 90 day refill to get her to her next annual scheduled for 11/22/18. Patient confirmed pharmacy on file.

## 2018-09-10 ENCOUNTER — Other Ambulatory Visit: Payer: Self-pay | Admitting: Neurology

## 2018-09-10 MED ORDER — SERTRALINE HCL 50 MG PO TABS: 50.0000 mg | ORAL_TABLET | Freq: Every day | ORAL | 0 refills | Status: DC

## 2018-09-15 ENCOUNTER — Ambulatory Visit: Payer: BLUE CROSS/BLUE SHIELD | Admitting: Obstetrics and Gynecology

## 2018-11-05 ENCOUNTER — Telehealth: Payer: Self-pay | Admitting: Nurse Practitioner

## 2018-11-05 ENCOUNTER — Other Ambulatory Visit: Payer: Self-pay | Admitting: Obstetrics and Gynecology

## 2018-11-05 MED ORDER — EPITOL 200 MG PO TABS: 200.0000 mg | ORAL_TABLET | Freq: Three times a day (TID) | ORAL | 0 refills | Status: DC

## 2018-11-05 MED ORDER — SERTRALINE HCL 50 MG PO TABS: 50.0000 mg | ORAL_TABLET | Freq: Every day | ORAL | 0 refills | Status: DC

## 2018-11-05 NOTE — Addendum Note (Signed)
Addended by: Brandon Melnick on: 11/05/2018 12:18 PM   Modules accepted: Orders

## 2018-11-05 NOTE — Telephone Encounter (Signed)
Pt has called for a refill on her EPITOL 200 MG tablet [facility]

## 2018-11-05 NOTE — Telephone Encounter (Signed)
Patient is asking for a refill of generic Zoloft until her aex 11/24/18. She states today is last day of her insurance and and will be on Medicare starting Sunday. She really would like this sent tin today if possible. She said " I realize I have rescheduled my appointment several times due to the death of my father. I will keep my appointment in March".  [facility] on file.

## 2018-11-05 NOTE — Telephone Encounter (Signed)
Has appt scheduled 11-10-18. Renewed for one month.

## 2018-11-05 NOTE — Telephone Encounter (Signed)
Medication refill request:Zoloft 50mg   Last AEX:  07/22/17 Next AEX: 3/18/2Bi- rads 1 neg  Refill authorized:#[id number] with 0 RF pended. Please advise.

## 2018-11-09 NOTE — Progress Notes (Signed)
[facility]  PATIENT: Tina Peck DOB: May 13, 1956   REASON FOR VISIT: Follow-up for seizure disorder  HISTORY FROM: patient     HISTORY OF PRESENT ILLNESS: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 She is taking and tolerating Epitol. She denies any dj vu, strange odors or taste. She has not had further seizure activity. She is tolerating her medication without side effects. EEG in the past was abnormal showing left frontotemporal epileptiform activity.  She returns for reevaluation.    REVIEW OF SYSTEMS: Full 14 system review of systems performed and notable only for those listed, all others are neg:  Constitutional: neg  Cardiovascular: neg Ear/Nose/Throat: neg  Skin: neg Eyes: neg Respiratory: neg Gastroitestinal: neg  Hematology/Lymphatic: neg  Endocrine: neg Musculoskeletal:neg Allergy/Immunology: neg Neurological: Isolated seizure Psychiatric: neg Sleep : neg   ALLERGIES: No Known Allergies  HOME MEDICATIONS: Outpatient Medications Prior to Visit  Medication Sig Dispense Refill  . aspirin 81 MG chewable tablet Chew 81 mg by mouth daily.    Marland Kitchen docusate sodium (COLACE) 100 MG capsule Take 100 mg by mouth daily.    . EPITOL 200 MG tablet Take 1 tablet (200 mg total) by mouth 3 (three) times daily. 90 tablet 0  . polyethylene glycol (MIRALAX / GLYCOLAX) packet Take 17 g by mouth daily. 1/2 cap po daily    . sertraline (ZOLOFT) 50 MG tablet Take 1 tablet (50 mg total) by mouth daily. 30 tablet 0  . gabapentin (NEURONTIN) 100 MG capsule Take 100 mg at bedtime by mouth.  0  . Cholecalciferol (VITAMIN D3) 5000 units CAPS Take 2 capsules by mouth 2 (two) times daily.    Marland Kitchen conjugated estrogens (PREMARIN) vaginal cream Use 1/2 g vaginally every night at bed time for the first 2 weeks, then use 1/2 g vaginally two or three times per week.  (Patient not taking: Reported on 10/21/2017) 30 g 2  . cyanocobalamin 1000 MCG tablet Take 1,000 mcg by mouth daily.    Mariane Baumgarten Calcium (STOOL SOFTENER PO) Take by mouth daily.     No facility-administered medications prior to visit.     PAST MEDICAL HISTORY: Past Medical History:  Diagnosis Date  . Seizure ([place]) 2007   frontal lobe, only one seizure in 2007, none since  . SVD (spontaneous vaginal delivery)    x 6    PAST SURGICAL HISTORY: Past Surgical History:  Procedure Laterality Date  . ABDOMINAL HYSTERECTOMY N/A 03/04/2016   Procedure: HYSTERECTOMY ABDOMINAL;  Surgeon: Nunzio Cobbs, MD;  Location: [facility];  Service: Gynecology;  Laterality: N/A;  ANESTHESIA CONSULT NEEDED  . ABDOMINAL SACROCOLPOPEXY N/A 03/04/2016   Procedure: ABDOMINO SACROCOLPOPEXY with HALBAN'S CULDOPLASTY;  Surgeon: Nunzio Cobbs, MD;  Location: [facility];  Service: Gynecology;  Laterality: N/A;  . ANTERIOR AND POSTERIOR REPAIR N/A 03/04/2016   Procedure: ANTERIOR (CYSTOCELE) AND POSTERIOR REPAIR (RECTOCELE);  Surgeon: Nunzio Cobbs, MD;  Location: [facility];  Service: Gynecology;  Laterality: N/A;  . BILATERAL SALPINGECTOMY  03/04/2016   Procedure: BILATERAL SALPINGECTOMY;  Surgeon: Nunzio Cobbs, MD;  Location: [facility];  Service: Gynecology;;  . BLADDER SUSPENSION N/A 03/04/2016   Procedure: TRANSVAGINAL TAPE (TVT) PROCEDURE exact midurethral sling;  Surgeon: Nunzio Cobbs, MD;  Location: [facility];  Service: Gynecology;  Laterality: N/A;  . COLONOSCOPY     normal  . CYSTO  N/A 03/04/2016   Procedure: Kathrene Alu;  Surgeon: Nunzio Cobbs, MD;  Location: [facility];  Service: Gynecology;  Laterality: N/A;  . DIAGNOSTIC LAPAROSCOPY Left 1986   LSO for dermoid cyst  . OOPHORECTOMY Left 1986   LSO-Dermoid  . OOPHORECTOMY  03/04/2016   Procedure: RIGHT OOPHORECTOMY;  Surgeon: Nunzio Cobbs, MD;  Location: [facility];  Service: Gynecology;;  . TONSILLECTOMY      as a child    FAMILY HISTORY: Family History  Problem Relation Age of Onset  . Osteoarthritis Mother   . Glaucoma Father 65  . Seizures Father        developed very late in life   . Spina bifida Brother   . Cancer Maternal Grandfather   . Diabetes Paternal Grandmother     SOCIAL HISTORY: Social History   Socioeconomic History  . Marital status: Married    Spouse name: Not on file  . Number of children: 6  . Years of education: 12th  . Highest education level: Not on file  Occupational History  . Occupation: homemaker  Social Needs  . Financial resource strain: Not on file  . Food insecurity:    Worry: Not on file    Inability: Not on file  . Transportation needs:    Medical: Not on file    Non-medical: Not on file  Tobacco Use  . Smoking status: Never Smoker  . Smokeless tobacco: Never Used  Substance and Sexual Activity  . Alcohol use: No    Alcohol/week: 0.0 standard drinks  . Drug use: No  . Sexual activity: Yes    Partners: Male    Birth control/protection: Post-menopausal, Surgical    Comment: TAH/Bil.salpingectomy/RSO  Lifestyle  . Physical activity:    Days per week: Not on file    Minutes per session: Not on file  . Stress: Not on file  Relationships  . Social connections:    Talks on phone: Not on file    Gets together: Not on file    Attends religious service: Not on file    Active member of club or organization: Not on file    Attends meetings of clubs or organizations: Not on file    Relationship status: Not on file  . Intimate partner violence:    Fear of current or ex partner: Not on file    Emotionally abused: Not on file    Physically abused: Not on file    Forced sexual activity: Not on file  Other Topics Concern  . Not on file  Social History Narrative   Patient lives at home with her husband and has 6 children. 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    Right handed   Caffeine: 32 oz coffee and about 24 oz hot  tea daily     PHYSICAL EXAM  Vitals:   11/10/18 0746  BP: 128/82  Pulse: 64  Weight: 173 lb (78.5 kg)  Height: 5\' 9"  (1.753 m)   Body mass index is 25.55 kg/m. General: well developed, well nourished, seated, in no evident distress  Head: head normocephalic and atraumatic. Oropharynx benign  Neck: supple  Neurologic Exam  Mental Status: Awake and fully alert. Follows all commands. Mood and affect appropriate.  Cranial Nerves: Pupils equal, briskly reactive to light. Extraocular movements full without nystagmus. Visual fields full to confrontation. Hearing intact and symmetric to finger snap. Facial sensation intact. Face, tongue, palate move normally and symmetrically. Neck flexion and  extension normal.  Motor: Normal bulk and tone. Normal strength in all tested extremity muscles.  Coordination: Rapid alternating movements normal in all extremities. Finger-to-nose and heel-to-shin performed accurately bilaterally.  Gait and Station: Arises from chair without difficulty. Stance is normal. Gait demonstrates normal stride length and balance . Able to heel, toe and tandem walk without difficulty.  Reflexes:2+ and symmetric. Toes downgoing.    DIAGNOSTIC DATA (LABS, IMAGING, TESTING) -  ASSESSMENT AND PLAN  63 y.o. year old female  has a past medical history of Seizure (2007). here  to follow up. Her seizure activity is currently well-controlled on Epitol..  Continue Epitol at current dose will refill for 1 year Labs today ,CBC, CMP  to monitor adverse effects of Epitol CBZ level to monitor for toxicity therapeutic level F/U yearly and prn Call for seizure activity Dennie Bible, [facility], [facility], APRN  [facility] [address] [place], [place] [address] [phone]

## 2018-11-10 ENCOUNTER — Encounter

## 2018-11-10 ENCOUNTER — Ambulatory Visit (INDEPENDENT_AMBULATORY_CARE_PROVIDER_SITE_OTHER): Payer: PPO | Admitting: Nurse Practitioner

## 2018-11-10 ENCOUNTER — Encounter: Payer: Self-pay | Admitting: Nurse Practitioner

## 2018-11-10 VITALS — BP 128/82 | HR 64 | Ht 69.0 in | Wt 173.0 lb

## 2018-11-10 DIAGNOSIS — G40309 Generalized idiopathic epilepsy and epileptic syndromes, not intractable, without status epilepticus: Secondary | ICD-10-CM | POA: Diagnosis not present

## 2018-11-10 DIAGNOSIS — Z5181 Encounter for therapeutic drug level monitoring: Secondary | ICD-10-CM | POA: Insufficient documentation

## 2018-11-10 MED ORDER — EPITOL 200 MG PO TABS: 200.0000 mg | ORAL_TABLET | Freq: Three times a day (TID) | ORAL | 3 refills | Status: DC

## 2018-11-10 NOTE — Patient Instructions (Signed)
Continue Epitol at current dose will refill for 1 year Labs today ,CBC, CMP and CBZ F/U yearly and prn Call for seizure activity

## 2018-11-11 ENCOUNTER — Telehealth: Payer: Self-pay | Admitting: *Deleted

## 2018-11-11 LAB — COMPREHENSIVE METABOLIC PANEL
ALBUMIN: 4.1 g/dL (ref 3.8–4.8)
ALK PHOS: 135 IU/L — AB (ref 39–117)
ALT: 20 IU/L (ref 0–32)
AST: 14 IU/L (ref 0–40)
Albumin/Globulin Ratio: 1.8 (ref 1.2–2.2)
BUN / CREAT RATIO: 19 (ref 12–28)
BUN: 12 mg/dL (ref 8–27)
Bilirubin Total: <0.2 mg/dL (ref 0.0–1.2)
CO2: 28 mmol/L (ref 20–29)
CREATININE: 0.62 mg/dL (ref 0.57–1.00)
Calcium: 9.4 mg/dL (ref 8.7–10.3)
Chloride: 104 mmol/L (ref 96–106)
GFR, EST AFRICAN AMERICAN: 110 mL/min/{1.73_m2} (ref >59)
GFR, EST NON AFRICAN AMERICAN: 96 mL/min/{1.73_m2} (ref >59)
GLOBULIN, TOTAL: 2.3 g/dL (ref 1.5–4.5)
Glucose: 108 mg/dL — ABNORMAL HIGH (ref 65–99)
Potassium: 5.2 mmol/L (ref 3.5–5.2)
Sodium: 142 mmol/L (ref 134–144)
Total Protein: 6.4 g/dL (ref 6.0–8.5)

## 2018-11-11 LAB — CBC WITH DIFFERENTIAL/PLATELET
Basophils Absolute: 0.1 10*3/uL (ref 0.0–0.2)
Basos: 1 %
EOS (ABSOLUTE): 0.2 10*3/uL (ref 0.0–0.4)
Eos: 3 %
Hematocrit: 42.4 % (ref 34.0–46.6)
Hemoglobin: 14.2 g/dL (ref 11.1–15.9)
Immature Grans (Abs): 0 10*3/uL (ref 0.0–0.1)
Immature Granulocytes: 0 %
Lymphocytes Absolute: 1.6 10*3/uL (ref 0.7–3.1)
Lymphs: 31 %
MCH: 29.7 pg (ref 26.6–33.0)
MCHC: 33.5 g/dL (ref 31.5–35.7)
MCV: 89 fL (ref 79–97)
Monocytes Absolute: 0.5 10*3/uL (ref 0.1–0.9)
Monocytes: 9 %
Neutrophils Absolute: 2.8 10*3/uL (ref 1.4–7.0)
Neutrophils: 56 %
Platelets: 338 10*3/uL (ref 150–450)
RBC: 4.78 x10E6/uL (ref 3.77–5.28)
RDW: 12.5 % (ref 11.7–15.4)
WBC: 5.1 10*3/uL (ref 3.4–10.8)

## 2018-11-11 LAB — CARBAMAZEPINE LEVEL, TOTAL: Carbamazepine (Tegretol), S: 4.7 ug/mL (ref 4.0–12.0)

## 2018-11-11 NOTE — Telephone Encounter (Signed)
Spoke to pt and relayed that her lab results per CM/NP were stable.  She verbalized understanding.

## 2018-11-11 NOTE — Telephone Encounter (Signed)
-----   Message from Dennie Bible, NP sent at 11/11/2018  8:27 AM EST ----- Labs stable please call the patient

## 2018-11-17 NOTE — Progress Notes (Signed)
I have reviewed and agreed above plan. 

## 2018-11-22 ENCOUNTER — Ambulatory Visit: Payer: BLUE CROSS/BLUE SHIELD | Admitting: Obstetrics and Gynecology

## 2018-11-24 ENCOUNTER — Ambulatory Visit: Payer: PPO | Admitting: Obstetrics and Gynecology

## 2018-12-31 ENCOUNTER — Other Ambulatory Visit: Payer: Self-pay | Admitting: Obstetrics and Gynecology

## 2018-12-31 MED ORDER — SERTRALINE HCL 50 MG PO TABS: 50.0000 mg | ORAL_TABLET | Freq: Every day | ORAL | 1 refills | Status: DC

## 2018-12-31 NOTE — Telephone Encounter (Signed)
Patient is in need of refill on Zoloft. [facility] in [place] on file.

## 2018-12-31 NOTE — Telephone Encounter (Signed)
Medication refill request: zoloft 50mg  Last AEX:  07-22-2017 Next AEX: 02-02-2019 Last MMG (if hormonal medication request):  n/a Refill authorized: please approve if appropriate

## 2019-01-28 ENCOUNTER — Other Ambulatory Visit: Payer: Self-pay

## 2019-02-02 ENCOUNTER — Encounter: Payer: Self-pay | Admitting: Obstetrics and Gynecology

## 2019-02-02 ENCOUNTER — Ambulatory Visit (INDEPENDENT_AMBULATORY_CARE_PROVIDER_SITE_OTHER): Payer: PPO | Admitting: Obstetrics and Gynecology

## 2019-02-02 ENCOUNTER — Other Ambulatory Visit: Payer: Self-pay

## 2019-02-02 VITALS — BP 118/70 | HR 76 | Temp 97.9°F | Resp 12 | Ht 68.75 in | Wt 167.0 lb

## 2019-02-02 DIAGNOSIS — Z01419 Encounter for gynecological examination (general) (routine) without abnormal findings: Secondary | ICD-10-CM | POA: Diagnosis not present

## 2019-02-02 MED ORDER — SERTRALINE HCL 50 MG PO TABS: 50.0000 mg | ORAL_TABLET | Freq: Every day | ORAL | 3 refills | Status: DC

## 2019-02-02 NOTE — Patient Instructions (Signed)

## 2019-02-02 NOTE — Progress Notes (Signed)
63 y.o. G32P6005 Married Caucasian female here for annual exam.    Walking daily.  Father died in the last year at age 19.  Son died 2 years ago.  Having marital indifference.  She was in counseling with her husband, but his has now stopped.   She states Zoloft is helping.   She is double voiding since surgery, but she is voiding a small amount the second time.  No stress incontinence.  NF - once.  DF - four times per day.  Not having urgency.  BM are normal and regular.   Not currently using vaginal estrogen cream.   Still working during pandemic. Family living in [place].  Everyone well.  PCP: Orpah Melter, MD    Patient's last menstrual period was 09/08/2006.           Sexually active: No.  The current method of family planning is status post hysterectomy.    Exercising: Yes.    walking Smoker:  no  Health Maintenance: Pap:  11-08-12 Neg:Neg HR HPV. Cervix at Hysterectomy - benign History of abnormal Pap:  no MMG:  11/19/15 BIRADS 1 negative/density b Colonoscopy:  4/17 f/u 10 years BMD:   Years ago per patient  Result  Normal  TDaP:  2016 Gardasil:   n/a HIV and Hep C: never Screening Labs: Neurologist   reports that she has never smoked. She has never used smokeless tobacco. She reports that she does not drink alcohol or use drugs.  Past Medical History:  Diagnosis Date  . Seizure ([place]) 2007   frontal lobe, only one seizure in 2007, none since  . SVD (spontaneous vaginal delivery)    x 6    Past Surgical History:  Procedure Laterality Date  . ABDOMINAL HYSTERECTOMY N/A 03/04/2016   Procedure: HYSTERECTOMY ABDOMINAL;  Surgeon: Nunzio Cobbs, MD;  Location: [facility];  Service: Gynecology;  Laterality: N/A;  ANESTHESIA CONSULT NEEDED  . ABDOMINAL SACROCOLPOPEXY N/A 03/04/2016   Procedure: ABDOMINO SACROCOLPOPEXY with HALBAN'S CULDOPLASTY;  Surgeon: Nunzio Cobbs, MD;  Location: [facility];  Service: Gynecology;  Laterality: N/A;  .  ANTERIOR AND POSTERIOR REPAIR N/A 03/04/2016   Procedure: ANTERIOR (CYSTOCELE) AND POSTERIOR REPAIR (RECTOCELE);  Surgeon: Nunzio Cobbs, MD;  Location: [facility];  Service: Gynecology;  Laterality: N/A;  . BILATERAL SALPINGECTOMY  03/04/2016   Procedure: BILATERAL SALPINGECTOMY;  Surgeon: Nunzio Cobbs, MD;  Location: [facility];  Service: Gynecology;;  . BLADDER SUSPENSION N/A 03/04/2016   Procedure: TRANSVAGINAL TAPE (TVT) PROCEDURE exact midurethral sling;  Surgeon: Nunzio Cobbs, MD;  Location: [facility];  Service: Gynecology;  Laterality: N/A;  . COLONOSCOPY     normal  . CYSTO N/A 03/04/2016   Procedure: CYSTO;  Surgeon: Nunzio Cobbs, MD;  Location: [facility];  Service: Gynecology;  Laterality: N/A;  . DIAGNOSTIC LAPAROSCOPY Left 1986   LSO for dermoid cyst  . OOPHORECTOMY Left 1986   LSO-Dermoid  . OOPHORECTOMY  03/04/2016   Procedure: RIGHT OOPHORECTOMY;  Surgeon: Nunzio Cobbs, MD;  Location: [facility];  Service: Gynecology;;  . TONSILLECTOMY     as a child    Current Outpatient Medications  Medication Sig Dispense Refill  . aspirin 81 MG chewable tablet Chew 81 mg by mouth daily.    Marland Kitchen docusate sodium (COLACE) 100 MG capsule Take 100 mg by mouth daily.    . EPITOL 200 MG tablet Take 1  tablet (200 mg total) by mouth 3 (three) times daily. 270 tablet 3  . polyethylene glycol (MIRALAX / GLYCOLAX) packet Take 17 g by mouth daily. 1/2 cap po daily    . sertraline (ZOLOFT) 50 MG tablet Take 1 tablet (50 mg total) by mouth daily. 30 tablet 1   No current facility-administered medications for this visit.     Family History  Problem Relation Age of Onset  . Osteoarthritis Mother   . Glaucoma Father 83  . Seizures Father        developed very late in life   . Spina bifida Brother   . Cancer Maternal Grandfather   . Diabetes Paternal Grandmother     Review of Systems  Constitutional: Negative.   HENT: Negative.   Eyes: Negative.    Respiratory: Negative.   Cardiovascular: Negative.   Gastrointestinal: Negative.   Endocrine: Negative.   Genitourinary: Negative.   Musculoskeletal: Negative.   Skin: Negative.   Allergic/Immunologic: Negative.   Neurological: Negative.   Hematological: Negative.   Psychiatric/Behavioral: Negative.     Exam:   BP 118/70 (BP Location: Right Arm, Patient Position: Sitting, Cuff Size: Normal)   Pulse 76   Temp 97.9 F (36.6 C) (Temporal)   Resp 12   Ht 5' 8.75" (1.746 m)   Wt 167 lb (75.8 kg)   LMP 09/08/2006   BMI 24.84 kg/m     General appearance: alert, cooperative and appears stated age Head: Normocephalic, without obvious abnormality, atraumatic Neck: no adenopathy, supple, symmetrical, trachea midline and thyroid normal to inspection and palpation Lungs: clear to auscultation bilaterally Breasts: normal appearance, no masses or tenderness, No nipple retraction or dimpling, No nipple discharge or bleeding, No axillary or supraclavicular adenopathy Heart: regular rate and rhythm Abdomen: soft, non-tender; no masses, no organomegaly Extremities: extremities normal, atraumatic, no cyanosis or edema Skin: Skin color, texture, turgor normal. No rashes or lesions Lymph nodes: Cervical, supraclavicular, and axillary nodes normal. No abnormal inguinal nodes palpated Neurologic: Grossly normal  Pelvic: External genitalia:  no lesions              Urethra:  normal appearing urethra with no masses, tenderness or lesions              Bartholins and Skenes: normal                 Vagina: normal appearing vagina with normal color and discharge, no lesions.  No mesh erosion or exposure.              Cervix:  Absent.              Pap taken: No. Bimanual Exam:  Uterus:   absent              Adnexa: no mass, fullness, tenderness              Rectal exam: Yes.  .  Confirms.              Anus:  normal sphincter tone, no lesions  Chaperone was present for exam.  Assessment:   Well  woman visit with normal exam. Status post TAH/BSO/abdominal sacrocolpopexy/anterior and posterior colporrhaphy/TVT/cysto. Bereavement.   Plan: Mammogram screening.  She will update.  Recommended self breast awareness. Pap and HR HPV as above. Guidelines for Calcium, Vitamin D, regular exercise program including cardiovascular and weight bearing exercise. After her mammogram is back, I can refill her vaginal estrogen.  We discussed potential effect on breast cancer.  Continue Zoloft 50 mg daily.  She will reach out to her personal counselor.  She will make an appointment to see her PCP.  Follow up annually and prn.   After visit summary provided.

## 2019-09-08 DIAGNOSIS — Z03818 Encounter for observation for suspected exposure to other biological agents ruled out: Secondary | ICD-10-CM | POA: Diagnosis not present

## 2019-09-08 DIAGNOSIS — M791 Myalgia, unspecified site: Secondary | ICD-10-CM | POA: Diagnosis not present

## 2019-09-08 DIAGNOSIS — R519 Headache, unspecified: Secondary | ICD-10-CM | POA: Diagnosis not present

## 2019-11-16 ENCOUNTER — Ambulatory Visit: Payer: PPO | Admitting: Family Medicine

## 2019-11-19 ENCOUNTER — Ambulatory Visit: Payer: PPO | Attending: Internal Medicine

## 2019-11-19 DIAGNOSIS — Z23 Encounter for immunization: Secondary | ICD-10-CM

## 2019-11-19 NOTE — Progress Notes (Signed)
   Covid-19 Vaccination Clinic  Name:  Tina Peck    MRN: [id number] DOB: 1956/09/05  11/19/2019  Tina Peck was observed post Covid-19 immunization for 15 minutes without incident. She was provided with Vaccine Information Sheet and instruction to access the V-Safe system.   Tina Peck was instructed to call 911 with any severe reactions post vaccine: Marland Kitchen Difficulty breathing  . Swelling of face and throat  . A fast heartbeat  . A bad rash all over body  . Dizziness and weakness   Immunizations Administered    Name Date Dose VIS Date Route   Pfizer COVID-19 Vaccine 11/19/2019  8:52 AM 0.3 mL 08/19/2019 Intramuscular   Manufacturer: [place]   Lot: [id number]   [place]: [id number]

## 2019-12-12 ENCOUNTER — Ambulatory Visit: Payer: PPO | Attending: Internal Medicine

## 2019-12-12 ENCOUNTER — Ambulatory Visit: Payer: PPO

## 2019-12-12 DIAGNOSIS — Z23 Encounter for immunization: Secondary | ICD-10-CM

## 2019-12-12 NOTE — Progress Notes (Signed)
   Covid-19 Vaccination Clinic  Name:  Kya Conine    MRN: [id number] DOB: Jan 28, 1956  12/12/2019  Ms. Kraning was observed post Covid-19 immunization for 15 minutes without incident. She was provided with Vaccine Information Sheet and instruction to access the V-Safe system.   Ms. Coan was instructed to call 911 with any severe reactions post vaccine: Marland Kitchen Difficulty breathing  . Swelling of face and throat  . A fast heartbeat  . A bad rash all over body  . Dizziness and weakness   Immunizations Administered    Name Date Dose VIS Date Route   Pfizer COVID-19 Vaccine 12/12/2019  5:16 PM 0.3 mL 08/19/2019 Intramuscular   Manufacturer: [place]   Lot: [id number]   [place]: [id number]

## 2019-12-19 ENCOUNTER — Other Ambulatory Visit: Payer: Self-pay | Admitting: Obstetrics and Gynecology

## 2019-12-19 NOTE — Telephone Encounter (Signed)
Medication refill request: Zoloft  Last AEX: 02/02/2019 Next AEX: 02/08/2020 with BS Last MMG (if hormonal medication request): n/a Refill authorized: #[id number], 0RF pended.  Please advise and refill if appropriate.

## 2020-02-08 ENCOUNTER — Ambulatory Visit: Payer: PPO | Admitting: Obstetrics and Gynecology

## 2020-02-23 NOTE — Progress Notes (Deleted)
64 y.o. P5T6144 Married Caucasian female here for annual exam.    PCP:     Patient's last menstrual period was 09/08/2006.           Sexually active: {yes no:314532}  The current method of family planning is status post hysterectomy.    Exercising: {yes no:314532}  {types:19826} Smoker:  no  Health Maintenance: Pap: 11-08-12 Neg:Neg HR HPV. Cervix at Hysterectomy - benign  History of abnormal Pap:  no MMG:  ***11-19-15 Neg/density B/BiRads1 Colonoscopy: 12/2015;next 10 years BMD: Years ago per pt.  Result :Normal TDaP:  2016 Gardasil:   no [id number] Hep C:Never Screening Labs:  Hb today: ***, Urine today: ***   reports that she has never smoked. She has never used smokeless tobacco. She reports that she does not drink alcohol and does not use drugs.  Past Medical History:  Diagnosis Date  . Seizure ([place]) 2007   frontal lobe, only one seizure in 2007, none since  . SVD (spontaneous vaginal delivery)    x 6    Past Surgical History:  Procedure Laterality Date  . ABDOMINAL HYSTERECTOMY N/A 03/04/2016   Procedure: HYSTERECTOMY ABDOMINAL;  Surgeon: Nunzio Cobbs, MD;  Location: [facility];  Service: Gynecology;  Laterality: N/A;  ANESTHESIA CONSULT NEEDED  . ABDOMINAL SACROCOLPOPEXY N/A 03/04/2016   Procedure: ABDOMINO SACROCOLPOPEXY with HALBAN'S CULDOPLASTY;  Surgeon: Nunzio Cobbs, MD;  Location: [facility];  Service: Gynecology;  Laterality: N/A;  . ANTERIOR AND POSTERIOR REPAIR N/A 03/04/2016   Procedure: ANTERIOR (CYSTOCELE) AND POSTERIOR REPAIR (RECTOCELE);  Surgeon: Nunzio Cobbs, MD;  Location: [facility];  Service: Gynecology;  Laterality: N/A;  . BILATERAL SALPINGECTOMY  03/04/2016   Procedure: BILATERAL SALPINGECTOMY;  Surgeon: Nunzio Cobbs, MD;  Location: [facility];  Service: Gynecology;;  . BLADDER SUSPENSION N/A 03/04/2016   Procedure: TRANSVAGINAL TAPE (TVT) PROCEDURE exact midurethral sling;  Surgeon: Nunzio Cobbs, MD;   Location: [facility];  Service: Gynecology;  Laterality: N/A;  . COLONOSCOPY     normal  . CYSTO N/A 03/04/2016   Procedure: CYSTO;  Surgeon: Nunzio Cobbs, MD;  Location: [facility];  Service: Gynecology;  Laterality: N/A;  . DIAGNOSTIC LAPAROSCOPY Left 1986   LSO for dermoid cyst  . OOPHORECTOMY Left 1986   LSO-Dermoid  . OOPHORECTOMY  03/04/2016   Procedure: RIGHT OOPHORECTOMY;  Surgeon: Nunzio Cobbs, MD;  Location: [facility];  Service: Gynecology;;  . TONSILLECTOMY     as a child    Current Outpatient Medications  Medication Sig Dispense Refill  . aspirin 81 MG chewable tablet Chew 81 mg by mouth daily.    Marland Kitchen docusate sodium (COLACE) 100 MG capsule Take 100 mg by mouth daily.    . EPITOL 200 MG tablet Take 1 tablet (200 mg total) by mouth 3 (three) times daily. 270 tablet 3  . polyethylene glycol (MIRALAX / GLYCOLAX) packet Take 17 g by mouth daily. 1/2 cap po daily    . sertraline (ZOLOFT) 50 MG tablet TAKE ONE TABLET BY MOUTH EVERY DAY 90 tablet 0   No current facility-administered medications for this visit.    Family History  Problem Relation Age of Onset  . Osteoarthritis Mother   . Glaucoma Father 66  . Seizures Father        developed very late in life   . Spina bifida Brother   . Cancer Maternal Grandfather   . Diabetes  Paternal Grandmother     Review of Systems  Exam:   LMP 09/08/2006     General appearance: alert, cooperative and appears stated age Head: normocephalic, without obvious abnormality, atraumatic Neck: no adenopathy, supple, symmetrical, trachea midline and thyroid normal to inspection and palpation Lungs: clear to auscultation bilaterally Breasts: normal appearance, no masses or tenderness, No nipple retraction or dimpling, No nipple discharge or bleeding, No axillary adenopathy Heart: regular rate and rhythm Abdomen: soft, non-tender; no masses, no organomegaly Extremities: extremities normal, atraumatic, no cyanosis or edema Skin:  skin color, texture, turgor normal. No rashes or lesions Lymph nodes: cervical, supraclavicular, and axillary nodes normal. Neurologic: grossly normal  Pelvic: External genitalia:  no lesions              No abnormal inguinal nodes palpated.              Urethra:  normal appearing urethra with no masses, tenderness or lesions              Bartholins and Skenes: normal                 Vagina: normal appearing vagina with normal color and discharge, no lesions              Cervix: no lesions              Pap taken: {yes no:314532} Bimanual Exam:  Uterus:  normal size, contour, position, consistency, mobility, non-tender              Adnexa: no mass, fullness, tenderness              Rectal exam: {yes no:314532}.  Confirms.              Anus:  normal sphincter tone, no lesions  Chaperone was present for exam.  Assessment:   Well woman visit with normal exam.   Plan: Mammogram screening discussed. Self breast awareness reviewed. Pap and HR HPV as above. Guidelines for Calcium, Vitamin D, regular exercise program including cardiovascular and weight bearing exercise.   Follow up annually and prn.   Additional counseling given.  {yes [id number]. _______ minutes face to face time of which over 50% was spent in counseling.    After visit summary provided.

## 2020-02-27 ENCOUNTER — Ambulatory Visit: Payer: PPO | Admitting: Obstetrics and Gynecology

## 2020-04-13 ENCOUNTER — Telehealth: Payer: Self-pay | Admitting: Neurology

## 2020-04-13 NOTE — Telephone Encounter (Signed)
[facility]([place]) called inquiring if carBAMazepine has been denied or approved. [facility] phone number: [phone] option 1

## 2020-04-16 ENCOUNTER — Other Ambulatory Visit: Payer: Self-pay | Admitting: *Deleted

## 2020-04-16 ENCOUNTER — Telehealth: Payer: Self-pay | Admitting: Neurology

## 2020-04-16 MED ORDER — CARBAMAZEPINE 200 MG PO TABS: 200.0000 mg | ORAL_TABLET | Freq: Three times a day (TID) | ORAL | 0 refills | Status: DC

## 2020-04-16 MED ORDER — EPITOL 200 MG PO TABS: 200.0000 mg | ORAL_TABLET | Freq: Three times a day (TID) | ORAL | 0 refills | Status: DC

## 2020-04-16 NOTE — Telephone Encounter (Signed)
Pt called needing to speak to the RN regarding her EPITOL 200 MG tablet Pt is wanting to know why all of a sudden she can not get the generic brand. Please advise.

## 2020-04-16 NOTE — Addendum Note (Signed)
Addended by: Brandon Melnick on: 04/16/2020 01:24 PM   Modules accepted: Orders

## 2020-04-16 NOTE — Telephone Encounter (Signed)
Pt takes EPITOL brand for her generic carbamazepine.  I placed new order for 30 day supply as pt needs appt scheduled.

## 2020-04-16 NOTE — Telephone Encounter (Signed)
I spoke to [place], at [place] and pt as well.  Pt has been getting generic (epitol) which is carbamazepine. 200mg  tablet TID.  Scored #[id number] pink chalky oblong tablet.  I refilled as generic carbamazepine with noted in comments.  Pt has been gettting since 05-2015 from [facility].

## 2020-04-17 NOTE — Telephone Encounter (Signed)
I reached out to the pt and advised we had been in touch with the pharmacy on 04/16/2020 providing clarification that generic epitol was needed.   Pt advised me she had received a call from the pharmacy and is planning on picking her medication up today. Pt had no other questions/concerns at this time and appreciated the call.

## 2020-05-05 DIAGNOSIS — J069 Acute upper respiratory infection, unspecified: Secondary | ICD-10-CM | POA: Diagnosis not present

## 2020-05-05 DIAGNOSIS — Z03818 Encounter for observation for suspected exposure to other biological agents ruled out: Secondary | ICD-10-CM | POA: Diagnosis not present

## 2020-05-15 ENCOUNTER — Telehealth: Payer: Self-pay

## 2020-05-15 NOTE — Telephone Encounter (Signed)
Called and spoke with patient. 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 She said that she spoke with someone at the front desk earlier today and they advised her that it would be ok for her to bring him.   She had no further concerns about appointment.   Nothing further needed at time of call.

## 2020-05-15 NOTE — Telephone Encounter (Signed)
Pt left a VM asking for a call back to discuss her appt for tomorrow.

## 2020-05-16 ENCOUNTER — Other Ambulatory Visit: Payer: Self-pay

## 2020-05-16 ENCOUNTER — Encounter: Payer: Self-pay | Admitting: Neurology

## 2020-05-16 ENCOUNTER — Ambulatory Visit: Payer: PPO | Admitting: Neurology

## 2020-05-16 VITALS — BP 117/68 | HR 72 | Ht 69.0 in | Wt 169.0 lb

## 2020-05-16 DIAGNOSIS — G40309 Generalized idiopathic epilepsy and epileptic syndromes, not intractable, without status epilepticus: Secondary | ICD-10-CM

## 2020-05-16 MED ORDER — CARBAMAZEPINE 200 MG PO TABS: 200.0000 mg | ORAL_TABLET | Freq: Three times a day (TID) | ORAL | 4 refills | Status: DC

## 2020-05-16 NOTE — Progress Notes (Signed)
PATIENT: Tina Peck DOB: 1955/10/01  REASON FOR VISIT: follow up HISTORY FROM: patient  HISTORY OF PRESENT ILLNESS: Today 05/16/20 Tina Peck is a 64 year old female with history of single nocturnal generalized seizure in 2007.  She remains on Carbamazepine (Epitol), no further seizure activity.  EEG in the past was abnormal showing left frontotemporal epileptiform activity. Is doing overall well, tolerating medication.  Recently had URI was treated with prednisone and antibiotic.  She takes care of her grandchild a few days a week, she presents today for follow-up with him.  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 She is taking and tolerating Epitol. She denies any dj vu, strange odors or taste. She has not had further seizure activity. She is tolerating her medication without side effects. EEG in the past was abnormal showing left frontotemporal epileptiform activity.  She returns for reevaluation.  REVIEW OF SYSTEMS: Out of a complete 14 system review of symptoms, the patient complains only of the following symptoms, and all other reviewed systems are negative.  N/A  ALLERGIES: No Known Allergies  HOME MEDICATIONS: Outpatient Medications Prior to Visit  Medication Sig Dispense Refill   aspirin 81 MG chewable tablet Chew 81 mg by mouth daily.     carbamazepine (EPITOL) 200 MG tablet Take 1 tablet (200 mg total) by mouth 3 (three) times daily. 90 tablet 0   polyethylene glycol (MIRALAX / GLYCOLAX) packet Take 17 g by mouth daily. 1/2 cap po daily     sertraline (ZOLOFT) 50 MG tablet TAKE ONE TABLET BY MOUTH EVERY DAY 90 tablet 0   docusate sodium (COLACE) 100 MG capsule Take 100 mg by mouth daily.     No facility-administered medications prior to visit.    PAST MEDICAL HISTORY: Past Medical History:    Diagnosis Date   Seizure ([place]) 2007   frontal lobe, only one seizure in 2007, none since   SVD (spontaneous vaginal delivery)    x 6    PAST SURGICAL HISTORY: Past Surgical History:  Procedure Laterality Date   ABDOMINAL HYSTERECTOMY N/A 03/04/2016   Procedure: HYSTERECTOMY ABDOMINAL;  Surgeon: Nunzio Cobbs, MD;  Location: [facility];  Service: Gynecology;  Laterality: N/A;  ANESTHESIA CONSULT NEEDED   ABDOMINAL SACROCOLPOPEXY N/A 03/04/2016   Procedure: ABDOMINO SACROCOLPOPEXY with HALBAN'S CULDOPLASTY;  Surgeon: Nunzio Cobbs, MD;  Location: [facility];  Service: Gynecology;  Laterality: N/A;   ANTERIOR AND POSTERIOR REPAIR N/A 03/04/2016   Procedure: ANTERIOR (CYSTOCELE) AND POSTERIOR REPAIR (RECTOCELE);  Surgeon: Nunzio Cobbs, MD;  Location: [facility];  Service: Gynecology;  Laterality: N/A;   BILATERAL SALPINGECTOMY  03/04/2016   Procedure: BILATERAL SALPINGECTOMY;  Surgeon: Nunzio Cobbs, MD;  Location: [facility];  Service: Gynecology;;   BLADDER SUSPENSION N/A 03/04/2016   Procedure: TRANSVAGINAL TAPE (TVT) PROCEDURE exact midurethral sling;  Surgeon: Nunzio Cobbs, MD;  Location: [facility];  Service: Gynecology;  Laterality: N/A;   COLONOSCOPY     normal   CYSTO N/A 03/04/2016   Procedure: CYSTO;  Surgeon: Nunzio Cobbs, MD;  Location: [facility];  Service: Gynecology;  Laterality: N/A;   DIAGNOSTIC LAPAROSCOPY Left 1986   LSO for dermoid cyst   OOPHORECTOMY Left 1986   LSO-Dermoid   OOPHORECTOMY  03/04/2016   Procedure: RIGHT OOPHORECTOMY;  Surgeon: Jamey Reas  Lorenz Coaster, MD;  Location: [facility];  Service: Gynecology;;   TONSILLECTOMY     as a child    FAMILY HISTORY: Family History  Problem Relation Age of Onset   Osteoarthritis Mother    Glaucoma Father 27   Seizures Father        developed very late in life    Spina bifida Brother    Cancer Maternal Grandfather    Diabetes Paternal Grandmother      SOCIAL HISTORY: Social History   Socioeconomic History   Marital status: Married    Spouse name: Not on file   Number of children: 6   Years of education: 12th   Highest education level: Not on file  Occupational History   Occupation: homemaker  Tobacco Use   Smoking status: Never Smoker   Smokeless tobacco: Never Used  Scientific laboratory technician Use: Never used  Substance and Sexual Activity   Alcohol use: No    Alcohol/week: 0.0 standard drinks   Drug use: No   Sexual activity: Yes    Partners: Male    Birth control/protection: Post-menopausal, Surgical    Comment: TAH/Bil.salpingectomy/RSO  Other Topics Concern   Not on file  Social History Narrative   Patient lives at home with her husband and has 6 children. 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    Right handed   Caffeine: 32 oz coffee and about 24 oz hot tea daily   Social Determinants of Health   Financial Resource Strain:    Difficulty of Paying Living Expenses: Not on file  Food Insecurity:    Worried About Charity fundraiser in the Last Year: Not on file   [employer] of Food in the Last Year: Not on file  Transportation Needs:    Lack of Transportation (Medical): Not on file   Lack of Transportation (Non-Medical): Not on file  Physical Activity:    Days of Exercise per Week: Not on file   Minutes of Exercise per Session: Not on file  Stress:    Feeling of Stress : Not on file  Social Connections:    Frequency of Communication with Friends and Family: Not on file   Frequency of Social Gatherings with Friends and Family: Not on file   Attends Religious Services: Not on file   Active Member of Clubs or Organizations: Not on file   Attends Archivist Meetings: Not on file   Marital Status: Not on file  Intimate Partner Violence:    Fear of Current or Ex-Partner: Not on file   Emotionally Abused: Not on file   Physically Abused: Not on file   Sexually  Abused: Not on file   PHYSICAL EXAM  Vitals:   05/16/20 0857  BP: 117/68  Pulse: 72  Weight: 169 lb (76.7 kg)  Height: 5\' 9"  (1.753 m)   Body mass index is 24.96 kg/m.  Generalized: Well developed, in no acute distress   Neurological examination  Mentation: Alert oriented to time, place, history taking. Follows all commands speech and language fluent Cranial nerve II-XII: Pupils were equal round reactive to light. Extraocular movements were full, visual field were full on confrontational test. Facial sensation and strength were normal.  Head turning and shoulder shrug  were normal and symmetric. Motor: The motor testing reveals 5 over 5 strength of all 4 extremities. Good symmetric motor tone is noted throughout.  Sensory: Sensory testing is intact to soft touch on all 4 extremities.  No evidence of extinction is noted.  Coordination: Cerebellar testing reveals good finger-nose-finger and heel-to-shin bilaterally.  Gait and station: Gait is normal.  Reflexes: Deep tendon reflexes are symmetric and normal bilaterally.   DIAGNOSTIC DATA (LABS, IMAGING, TESTING) - I reviewed patient records, labs, notes, testing and imaging myself where available.  Lab Results  Component Value Date   WBC 5.1 11/10/2018   HGB 14.2 11/10/2018   HCT 42.4 11/10/2018   MCV 89 11/10/2018   PLT 338 11/10/2018      Component Value Date/Time   NA 142 11/10/2018 0828   K 5.2 11/10/2018 0828   CL 104 11/10/2018 0828   CO2 28 11/10/2018 0828   GLUCOSE 108 (H) 11/10/2018 0828   GLUCOSE 117 (H) 03/05/2016 0542   BUN 12 11/10/2018 0828   CREATININE 0.62 11/10/2018 0828   CALCIUM 9.4 11/10/2018 0828   PROT 6.4 11/10/2018 0828   ALBUMIN 4.1 11/10/2018 0828   AST 14 11/10/2018 0828   ALT 20 11/10/2018 0828   ALKPHOS 135 (H) 11/10/2018 0828   BILITOT <0.2 11/10/2018 0828   GFRNONAA 96 11/10/2018 0828   GFRAA 110 11/10/2018 0828   Lab Results  Component Value Date   CHOL 202 (H) 06/04/2016   HDL 56  06/04/2016   LDLCALC 130 (H) 06/04/2016   TRIG 81 06/04/2016   CHOLHDL 3.6 06/04/2016   No results found for: HGBA1C No results found for: VITAMINB12 Lab Results  Component Value Date   TSH 0.80 06/04/2016    ASSESSMENT AND PLAN 64 y.o. year old female  has a past medical history of Seizure ([place]) (2007) and SVD (spontaneous vaginal delivery). here with:  1.  History of seizure in 2007, well controlled -Continue Epitol (carbamazepine), 200 mg, 3 times daily -Check routine blood work today -Call for seizure -Follow-up in 1 year or sooner if needed  I spent 20 minutes of face-to-face and non-face-to-face time with patient.  This included previsit chart review, lab review, study review, order entry, electronic health record documentation, patient education.  Butler Denmark, AGNP-C, DNP 05/16/2020, 9:12 AM [facility] [address] [place], [place] [address] [phone]

## 2020-05-16 NOTE — Patient Instructions (Signed)
It was great to meet you today! Continue current medications Check blood work today See you back in 1 year

## 2020-05-17 ENCOUNTER — Telehealth: Payer: Self-pay

## 2020-05-17 LAB — COMPREHENSIVE METABOLIC PANEL
ALT: 41 IU/L — ABNORMAL HIGH (ref 0–32)
AST: 23 IU/L (ref 0–40)
Albumin/Globulin Ratio: 1.4 (ref 1.2–2.2)
Albumin: 3.8 g/dL (ref 3.8–4.8)
Alkaline Phosphatase: 157 IU/L — ABNORMAL HIGH (ref 48–121)
BUN/Creatinine Ratio: 14 (ref 12–28)
BUN: 8 mg/dL (ref 8–27)
Bilirubin Total: 0.3 mg/dL (ref 0.0–1.2)
CO2: 25 mmol/L (ref 20–29)
Calcium: 8.7 mg/dL (ref 8.7–10.3)
Chloride: 99 mmol/L (ref 96–106)
Creatinine, Ser: 0.58 mg/dL (ref 0.57–1.00)
GFR calc Af Amer: 111 mL/min/{1.73_m2} (ref >59)
GFR calc non Af Amer: 96 mL/min/{1.73_m2} (ref >59)
Globulin, Total: 2.8 g/dL (ref 1.5–4.5)
Glucose: 96 mg/dL (ref 65–99)
Potassium: 4.6 mmol/L (ref 3.5–5.2)
Sodium: 137 mmol/L (ref 134–144)
Total Protein: 6.6 g/dL (ref 6.0–8.5)

## 2020-05-17 LAB — CBC WITH DIFFERENTIAL/PLATELET
Basophils Absolute: 0.1 10*3/uL (ref 0.0–0.2)
Basos: 1 %
EOS (ABSOLUTE): 0.1 10*3/uL (ref 0.0–0.4)
Eos: 2 %
Hematocrit: 42.1 % (ref 34.0–46.6)
Hemoglobin: 13.9 g/dL (ref 11.1–15.9)
Immature Grans (Abs): 0 10*3/uL (ref 0.0–0.1)
Immature Granulocytes: 0 %
Lymphocytes Absolute: 3.7 10*3/uL — ABNORMAL HIGH (ref 0.7–3.1)
Lymphs: 46 %
MCH: 29.9 pg (ref 26.6–33.0)
MCHC: 33 g/dL (ref 31.5–35.7)
MCV: 91 fL (ref 79–97)
Monocytes Absolute: 0.9 10*3/uL (ref 0.1–0.9)
Monocytes: 12 %
Neutrophils Absolute: 3.1 10*3/uL (ref 1.4–7.0)
Neutrophils: 39 %
Platelets: 371 10*3/uL (ref 150–450)
RBC: 4.65 x10E6/uL (ref 3.77–5.28)
RDW: 13.5 % (ref 11.7–15.4)
WBC: 7.9 10*3/uL (ref 3.4–10.8)

## 2020-05-17 LAB — CARBAMAZEPINE LEVEL, TOTAL: Carbamazepine (Tegretol), S: 3.7 ug/mL — ABNORMAL LOW (ref 4.0–12.0)

## 2020-05-17 NOTE — Telephone Encounter (Signed)
-----  Message from Suzzanne Cloud, NP sent at 05/17/2020  9:21 AM EDT ----- Labs are stable, slight elevated alk phos 157, carbamazepine slightly low at 3.7,  minimally elevated ALT 41, can continue current dosing 1 tablet 3 times daily, no recurrent seizure reported.

## 2020-05-17 NOTE — Telephone Encounter (Signed)
Tina Peck is a 64 y.o. female was contacted and notified of the message below. Pt has no additional questions or concerns.

## 2020-06-06 DIAGNOSIS — H2513 Age-related nuclear cataract, bilateral: Secondary | ICD-10-CM | POA: Diagnosis not present

## 2020-07-09 NOTE — Progress Notes (Signed)
64 y.o. [id number] Married White or Caucasian Not Hispanic or Latino female here for annual exam.   H/O TAH/sacrocolopexy/TVT in 2017. No vaginal bleeding.  No bowel or bladder changes.  Rarely sexually active, some discomfort, not a problem. Has vaginal estrogen occasionally uses it.     Not seeing a primary care.   Patient's last menstrual period was 09/08/2006.          Sexually active: Yes.    The current method of family planning is post menopausal status.    Exercising: Yes.    walking  Smoker:  no  Health Maintenance: Pap:  11-08-12 Neg:Neg HR HPV. Cervix at Hysterectomy - benign History of abnormal Pap:  no MMG:   11/19/15 BIRADS 1 negative/density b BMD:   Years ago per patient  Result  Normal  Colonoscopy: 4/17 f/u 10 years  TDaP:  2016  Gardasil: none    reports that she has never smoked. She has never used smokeless tobacco. She reports that she does not drink alcohol and does not use drugs. Has 5 surviving kids. She has 4 grandkids (19,20, 6 months and 1 year). She watches her 79 year old grandson.  One daughter is special needs, lives with them. Her oldest son died 4 years ago of a PE. Had lupus anticoagulant, wasn't good at taking his medication.   Past Medical History:  Diagnosis Date  . Seizure ([place]) 2007   frontal lobe, only one seizure in 2007, none since  . SVD (spontaneous vaginal delivery)    x 6    Past Surgical History:  Procedure Laterality Date  . ABDOMINAL HYSTERECTOMY N/A 03/04/2016   Procedure: HYSTERECTOMY ABDOMINAL;  Surgeon: Nunzio Cobbs, MD;  Location: [facility];  Service: Gynecology;  Laterality: N/A;  ANESTHESIA CONSULT NEEDED  . ABDOMINAL SACROCOLPOPEXY N/A 03/04/2016   Procedure: ABDOMINO SACROCOLPOPEXY with HALBAN'S CULDOPLASTY;  Surgeon: Nunzio Cobbs, MD;  Location: [facility];  Service: Gynecology;  Laterality: N/A;  . ANTERIOR AND POSTERIOR REPAIR N/A 03/04/2016   Procedure: ANTERIOR (CYSTOCELE) AND POSTERIOR REPAIR (RECTOCELE);   Surgeon: Nunzio Cobbs, MD;  Location: [facility];  Service: Gynecology;  Laterality: N/A;  . BILATERAL SALPINGECTOMY  03/04/2016   Procedure: BILATERAL SALPINGECTOMY;  Surgeon: Nunzio Cobbs, MD;  Location: [facility];  Service: Gynecology;;  . BLADDER SUSPENSION N/A 03/04/2016   Procedure: TRANSVAGINAL TAPE (TVT) PROCEDURE exact midurethral sling;  Surgeon: Nunzio Cobbs, MD;  Location: [facility];  Service: Gynecology;  Laterality: N/A;  . COLONOSCOPY     normal  . CYSTO N/A 03/04/2016   Procedure: CYSTO;  Surgeon: Nunzio Cobbs, MD;  Location: [facility];  Service: Gynecology;  Laterality: N/A;  . DIAGNOSTIC LAPAROSCOPY Left 1986   LSO for dermoid cyst  . OOPHORECTOMY Left 1986   LSO-Dermoid  . OOPHORECTOMY  03/04/2016   Procedure: RIGHT OOPHORECTOMY;  Surgeon: Nunzio Cobbs, MD;  Location: [facility];  Service: Gynecology;;  . TONSILLECTOMY     as a child    Current Outpatient Medications  Medication Sig Dispense Refill  . aspirin 81 MG chewable tablet Chew 81 mg by mouth daily.    . carbamazepine (EPITOL) 200 MG tablet Take 1 tablet (200 mg total) by mouth 3 (three) times daily. 270 tablet 4  . polyethylene glycol (MIRALAX / GLYCOLAX) packet Take 17 g by mouth daily. 1/2 cap po daily    . sertraline (ZOLOFT) 50 MG  tablet TAKE ONE TABLET BY MOUTH EVERY DAY 90 tablet 0   No current facility-administered medications for this visit.    Family History  Problem Relation Age of Onset  . Osteoarthritis Mother   . Glaucoma Father 90  . Seizures Father        developed very late in life   . Spina bifida Brother   . Cancer Maternal Grandfather   . Diabetes Paternal Grandmother     Review of Systems  All other systems reviewed and are negative.   Exam:   BP 122/64   Pulse 70   Ht 5' 8.5" (1.74 m)   Wt 169 lb (76.7 kg)   LMP 09/08/2006   SpO2 100%   BMI 25.32 kg/m   Weight change: @WEIGHTCHANGE @ Height:   Height: 5' 8.5" (174 cm)  Ht  Readings from Last 3 Encounters:  07/11/20 5' 8.5" (1.74 m)  05/16/20 5\' 9"  (1.753 m)  02/02/19 5' 8.75" (1.746 m)    General appearance: alert, cooperative and appears stated age Head: Normocephalic, without obvious abnormality, atraumatic Neck: no adenopathy, supple, symmetrical, trachea midline and thyroid normal to inspection and palpation Lungs: clear to auscultation bilaterally Cardiovascular: regular rate and rhythm Breasts: normal appearance, no masses or tenderness Abdomen: soft, non-tender; non distended,  no masses,  no organomegaly Extremities: extremities normal, atraumatic, no cyanosis or edema Skin: Skin color, texture, turgor normal. No rashes or lesions Lymph nodes: Cervical, supraclavicular, and axillary nodes normal. No abnormal inguinal nodes palpated Neurologic: Grossly normal   Pelvic: External genitalia:  no lesions              Urethra:  normal appearing urethra with no masses, tenderness or lesions              Bartholins and Skenes: normal                 Vagina: normal appearing vagina with normal color and discharge, no lesions. The mesh from the sacrocolpopexy could be felt but not seen.               Cervix: absent               Bimanual Exam:  Uterus:  uterus absent              Adnexa: no mass, fullness, tenderness               Rectovaginal: Confirms               Anus:  normal sphincter tone, no lesions  Gae Dry chaperoned for the exam.  A:  Well Woman with normal exam  H/O depression, controlled on Zoloft  Vaginal atrophy, not bothersome  P:   No pap needed  Mammogram overdue she will schedule  Continue Zolfoft  Lipids (other labs with Neurology)  Discussed breast self exam  Discussed calcium and vit D intake

## 2020-07-11 ENCOUNTER — Ambulatory Visit (INDEPENDENT_AMBULATORY_CARE_PROVIDER_SITE_OTHER): Payer: PPO | Admitting: Obstetrics and Gynecology

## 2020-07-11 ENCOUNTER — Encounter: Payer: Self-pay | Admitting: Obstetrics and Gynecology

## 2020-07-11 ENCOUNTER — Other Ambulatory Visit: Payer: Self-pay

## 2020-07-11 VITALS — BP 122/64 | HR 70 | Ht 68.5 in | Wt 169.0 lb

## 2020-07-11 DIAGNOSIS — Z0000 Encounter for general adult medical examination without abnormal findings: Secondary | ICD-10-CM | POA: Diagnosis not present

## 2020-07-11 DIAGNOSIS — N952 Postmenopausal atrophic vaginitis: Secondary | ICD-10-CM

## 2020-07-11 DIAGNOSIS — Z8659 Personal history of other mental and behavioral disorders: Secondary | ICD-10-CM

## 2020-07-11 DIAGNOSIS — Z01419 Encounter for gynecological examination (general) (routine) without abnormal findings: Secondary | ICD-10-CM | POA: Diagnosis not present

## 2020-07-11 MED ORDER — SERTRALINE HCL 50 MG PO TABS
50.0000 mg | ORAL_TABLET | Freq: Every day | ORAL | 3 refills | Status: DC
Start: 2020-07-11 — End: 2021-07-25

## 2020-07-11 NOTE — Progress Notes (Signed)
I have reviewed and agreed above plan. 

## 2020-07-11 NOTE — Patient Instructions (Signed)
EXERCISE AND DIET:  We recommended that you start or continue a regular exercise program for good health. Regular exercise means any activity that makes your heart beat faster and makes you sweat.  We recommend exercising at least 30 minutes per day at least 3 days a week, preferably 4 or 5.  We also recommend a diet low in fat and sugar.  Inactivity, poor dietary choices and obesity can cause diabetes, heart attack, stroke, and kidney damage, among others.    ALCOHOL AND SMOKING:  Women should limit their alcohol intake to no more than 7 drinks/beers/glasses of wine (combined, not each!) per week. Moderation of alcohol intake to this level decreases your risk of breast cancer and liver damage. And of course, no recreational drugs are part of a healthy lifestyle.  And absolutely no smoking or even second hand smoke. Most people know smoking can cause heart and lung diseases, but did you know it also contributes to weakening of your bones? Aging of your skin?  Yellowing of your teeth and nails?  CALCIUM AND VITAMIN D:  Adequate intake of calcium and Vitamin D are recommended.  The recommendations for exact amounts of these supplements seem to change often, but generally speaking 1,200 mg of calcium (between diet and supplement) and 800 units of Vitamin D per day seems prudent. Certain women may benefit from higher intake of Vitamin D.  If you are among these women, your doctor will have told you during your visit.    PAP SMEARS:  Pap smears, to check for cervical cancer or precancers,  have traditionally been done yearly, although recent scientific advances have shown that most women can have pap smears less often.  However, every woman still should have a physical exam from her gynecologist every year. It will include a breast check, inspection of the vulva and vagina to check for abnormal growths or skin changes, a visual exam of the cervix, and then an exam to evaluate the size and shape of the uterus and  ovaries.  And after 66 years of age, a rectal exam is indicated to check for rectal cancers. We will also provide age appropriate advice regarding health maintenance, like when you should have certain vaccines, screening for sexually transmitted diseases, bone density testing, colonoscopy, mammograms, etc.   MAMMOGRAMS:  All women over 40 years old should have a yearly mammogram. Many facilities now offer a "3D" mammogram, which may cost around $50 extra out of pocket. If possible,  we recommend you accept the option to have the 3D mammogram performed.  It both reduces the number of women who will be called back for extra views which then turn out to be normal, and it is better than the routine mammogram at detecting truly abnormal areas.    COLON CANCER SCREENING: Now recommend starting at age 45. At this time colonoscopy is not covered for routine screening until 50. There are take home tests that can be done between 45-49.   COLONOSCOPY:  Colonoscopy to screen for colon cancer is recommended for all women at age 50.  We know, you hate the idea of the prep.  We agree, BUT, having colon cancer and not knowing it is worse!!  Colon cancer so often starts as a polyp that can be seen and removed at colonscopy, which can quite literally save your life!  And if your first colonoscopy is normal and you have no family history of colon cancer, most women don't have to have it again for   10 years.  Once every ten years, you can do something that may end up saving your life, right?  We will be happy to help you get it scheduled when you are ready.  Be sure to check your insurance coverage so you understand how much it will cost.  It may be covered as a preventative service at no cost, but you should check your particular policy.      Breast Self-Awareness Breast self-awareness means being familiar with how your breasts look and feel. It involves checking your breasts regularly and reporting any changes to your  health care provider. Practicing breast self-awareness is important. A change in your breasts can be a sign of a serious medical problem. Being familiar with how your breasts look and feel allows you to find any problems early, when treatment is more likely to be successful. All women should practice breast self-awareness, including women who have had breast implants. How to do a breast self-exam One way to learn what is normal for your breasts and whether your breasts are changing is to do a breast self-exam. To do a breast self-exam: Look for Changes  1. Remove all the clothing above your waist. 2. Stand in front of a mirror in a room with good lighting. 3. Put your hands on your hips. 4. Push your hands firmly downward. 5. Compare your breasts in the mirror. Look for differences between them (asymmetry), such as: ? Differences in shape. ? Differences in size. ? Puckers, dips, and bumps in one breast and not the other. 6. Look at each breast for changes in your skin, such as: ? Redness. ? Scaly areas. 7. Look for changes in your nipples, such as: ? Discharge. ? Bleeding. ? Dimpling. ? Redness. ? A change in position. Feel for Changes Carefully feel your breasts for lumps and changes. It is best to do this while lying on your back on the floor and again while sitting or standing in the shower or tub with soapy water on your skin. Feel each breast in the following way:  Place the arm on the side of the breast you are examining above your head.  Feel your breast with the other hand.  Start in the nipple area and make  inch (2 cm) overlapping circles to feel your breast. Use the pads of your three middle fingers to do this. Apply light pressure, then medium pressure, then firm pressure. The light pressure will allow you to feel the tissue closest to the skin. The medium pressure will allow you to feel the tissue that is a little deeper. The firm pressure will allow you to feel the tissue  close to the ribs.  Continue the overlapping circles, moving downward over the breast until you feel your ribs below your breast.  Move one finger-width toward the center of the body. Continue to use the  inch (2 cm) overlapping circles to feel your breast as you move slowly up toward your collarbone.  Continue the up and down exam using all three pressures until you reach your armpit.  Write Down What You Find  Write down what is normal for each breast and any changes that you find. Keep a written record with breast changes or normal findings for each breast. By writing this information down, you do not need to depend only on memory for size, tenderness, or location. Write down where you are in your menstrual cycle, if you are still menstruating. If you are having trouble noticing differences   in your breasts, do not get discouraged. With time you will become more familiar with the variations in your breasts and more comfortable with the exam. How often should I examine my breasts? Examine your breasts every month. If you are breastfeeding, the best time to examine your breasts is after a feeding or after using a breast pump. If you menstruate, the best time to examine your breasts is 5-7 days after your period is over. During your period, your breasts are lumpier, and it may be more difficult to notice changes. When should I see my health care provider? See your health care provider if you notice:  A change in shape or size of your breasts or nipples.  A change in the skin of your breast or nipples, such as a reddened or scaly area.  Unusual discharge from your nipples.  A lump or thick area that was not there before.  Pain in your breasts.  Anything that concerns you.  

## 2020-07-12 LAB — LIPID PANEL
Chol/HDL Ratio: 3.8 ratio (ref 0.0–4.4)
Cholesterol, Total: 213 mg/dL — ABNORMAL HIGH (ref 100–199)
HDL: 56 mg/dL (ref >39)
LDL Chol Calc (NIH): 136 mg/dL — ABNORMAL HIGH (ref 0–99)
Triglycerides: 120 mg/dL (ref 0–149)
VLDL Cholesterol Cal: 21 mg/dL (ref 5–40)

## 2020-09-28 DIAGNOSIS — Z03818 Encounter for observation for suspected exposure to other biological agents ruled out: Secondary | ICD-10-CM | POA: Diagnosis not present

## 2020-09-28 DIAGNOSIS — J029 Acute pharyngitis, unspecified: Secondary | ICD-10-CM | POA: Diagnosis not present

## 2020-09-28 DIAGNOSIS — Z1152 Encounter for screening for COVID-19: Secondary | ICD-10-CM | POA: Diagnosis not present

## 2020-12-15 ENCOUNTER — Telehealth: Payer: Self-pay | Admitting: Unknown Physician Specialty

## 2020-12-15 MED ORDER — MOLNUPIRAVIR EUA 200MG CAPSULE: 4.0000 | ORAL_CAPSULE | Freq: Two times a day (BID) | ORAL | 0 refills | Status: AC

## 2020-12-15 MED ORDER — MOLNUPIRAVIR EUA 200MG CAPSULE: 4.0000 | ORAL_CAPSULE | Freq: Two times a day (BID) | ORAL | 0 refills | Status: DC

## 2020-12-15 NOTE — Telephone Encounter (Signed)
Outpatient Oral COVID Treatment Note  I connected with Waylan Boga on 12/15/2020/11:21 AM by telephone and verified that I am speaking with the correct person using two identifiers.  I discussed the limitations, risks, security, and privacy concerns of performing an evaluation and management service by telephone and the availability of in person appointments. I also discussed with the patient that there may be a patient responsible charge related to this service. The patient expressed understanding and agreed to proceed.  Patient location: home Provider location: home  Diagnosis: COVID-19 infection  Purpose of visit: Discussion of potential use of Molnupiravir or Paxlovid, a new treatment for mild to moderate COVID-19 viral infection in non-hospitalized patients.   Subjective: Patient is a 65 y.o. female who has been diagnosed with COVID 19 viral infection.  Their symptoms began on 4/7  With body aches.    Past Medical History:  Diagnosis Date  . Seizure ([place]) 2007   frontal lobe, only one seizure in 2007, none since  . SVD (spontaneous vaginal delivery)    x 6    No Known Allergies   Current Outpatient Medications:  .  aspirin 81 MG chewable tablet, Chew 81 mg by mouth daily., Disp: , Rfl:  .  carbamazepine (EPITOL) 200 MG tablet, Take 1 tablet (200 mg total) by mouth 3 (three) times daily., Disp: 270 tablet, Rfl: 4 .  polyethylene glycol (MIRALAX / GLYCOLAX) packet, Take 17 g by mouth daily. 1/2 cap po daily, Disp: , Rfl:  .  sertraline (ZOLOFT) 50 MG tablet, Take 1 tablet (50 mg total) by mouth daily., Disp: 90 tablet, Rfl: 3  Objective: Patient appears/sounds well.  They are in no apparent distress.  Breathing is non labored.  Mood and behavior are normal.  Laboratory Data:  No results found for this or any previous visit (from the past 2160 hour(s)).   Assessment: 65 y.o. female with mild/moderate COVID 19 viral infection diagnosed on 4/7 at high risk for progression to  severe COVID 19.  Plan:  This patient is a 64 y.o. female that meets the following criteria for Emergency Use Authorization of: Molnupiravir  1. Age >18 yr 2. SARS-COV-2 positive test 3. Symptom onset < 5 days 4. Mild-to-moderate COVID disease with high risk for severe progression to hospitalization or death   I have spoken and communicated the following to the patient or parent/caregiver regarding: 1. Molnupiravir is an unapproved drug that is authorized for use under an [employer] production planner.  2. There are no adequate, approved, available products for the treatment of COVID-19 in adults who have mild-to-moderate COVID-19 and are at high risk for progressing to severe COVID-19, including hospitalization or death. 3. Other therapeutics are currently authorized. For additional information on all products authorized for treatment or prevention of COVID-19, please see [employer].  4. There are benefits and risks of taking this treatment as outlined in the "Fact Sheet for Patients and Caregivers."  5. "Fact Sheet for Patients and Caregivers" was reviewed with patient. A hard copy will be provided to patient from pharmacy prior to the patient receiving treatment. 6. Patients should continue to self-isolate and use infection control measures (e.g., wear mask, isolate, social distance, avoid sharing personal items, clean and disinfect "high touch" surfaces, and frequent handwashing) according to CDC guidelines.  7. The patient or parent/caregiver has the option to accept or refuse treatment. 8. [place] has established a pregnancy surveillance program. 49. Females of childbearing potential should use a reliable method of contraception  correctly and consistently, as applicable, for the duration of treatment and for 4 days after the last dose of Molnupiravir. 51. Males of  reproductive potential who are sexually active with females of childbearing potential should use a reliable method of contraception correctly and consistently during treatment and for at least 3 months after the last dose. 11. Pregnancy status and risk was assessed. Patient verbalized understanding of precautions.   After reviewing above information with the patient, the patient agrees to receive molnupiravir.  Follow up instructions:    . Take prescription BID x 5 days as directed . Reach out to pharmacist for counseling on medication if desired . For concerns regarding further COVID symptoms please follow up with your PCP or urgent care . For urgent or life-threatening issues, seek care at your local emergency department  The patient was provided an opportunity to ask questions, and all were answered. The patient agreed with the plan and demonstrated an understanding of the instructions.   Script sent to her pharmacy  The patient was advised to call their PCP or seek an in-person evaluation if the symptoms worsen or if the condition fails to improve as anticipated.   I provided 20 minutes of non face-to-face telephone visit time during this encounter, and > 50% was spent counseling as documented under my assessment & plan.  Kathrine Haddock, NP 12/15/2020 /11:21 AM   Unable to give Pax due to interaction with Carmamazepine.  Number given if no improvement to consider mab on Wednesday.

## 2020-12-15 NOTE — Telephone Encounter (Signed)
Called medication to a different pharmacy

## 2020-12-16 ENCOUNTER — Other Ambulatory Visit: Payer: Self-pay | Admitting: Unknown Physician Specialty

## 2020-12-16 ENCOUNTER — Telehealth: Payer: Self-pay | Admitting: Unknown Physician Specialty

## 2020-12-16 DIAGNOSIS — U071 COVID-19: Secondary | ICD-10-CM

## 2020-12-16 NOTE — Telephone Encounter (Signed)
I connected by phone with Tina Peck on 12/16/2020 at 5:11 PM, she is unable to take Paxlovid due to size of the pills and wants mab treatment.    This patient is a 65 y.o. female that meets the FDA criteria for Emergency Use Authorization of COVID monoclonal antibody bebtelovimab.  Has a (+) direct SARS-CoV-2 viral test result  Has mild or moderate COVID-19   Is NOT hospitalized due to COVID-19  Is within 10 days of symptom onset  Has at least one of the high risk factor(s) for progression to severe COVID-19 and/or hospitalization as defined in EUA.  Specific high risk criteria : Older age (>/= 68 yo)   I have spoken and communicated the following to the patient or parent/caregiver regarding COVID monoclonal antibody treatment:  1. FDA has authorized the emergency use for the treatment of mild to moderate COVID-19 in adults and pediatric patients with positive results of direct SARS-CoV-2 viral testing who are 51 years of age and older weighing at least 40 kg, and who are at high risk for progressing to severe COVID-19 and/or hospitalization.  2. The significant known and potential risks and benefits of COVID monoclonal antibody, and the extent to which such potential risks and benefits are unknown.  3. Information on available alternative treatments and the risks and benefits of those alternatives, including clinical trials.  4. Patients treated with COVID monoclonal antibody should continue to self-isolate and use infection control measures (e.g., wear mask, isolate, social distance, avoid sharing personal items, clean and disinfect "high touch" surfaces, and frequent handwashing) according to CDC guidelines.   5. The patient or parent/caregiver has the option to accept or refuse COVID monoclonal antibody treatment.  After reviewing this information with the patient, the patient has agreed to receive one of the available covid 19 monoclonal antibodies and will be provided an  appropriate fact sheet prior to infusion. Tina Haddock, NP 12/16/2020 5:11 PM  Sx onset 4/7

## 2020-12-19 ENCOUNTER — Ambulatory Visit (HOSPITAL_COMMUNITY)
Admission: RE | Admit: 2020-12-19 | Discharge: 2020-12-19 | Disposition: A | Discharge status: Home / Self Care | Payer: PPO | Source: Ambulatory Visit | Attending: Pulmonary Disease | Admitting: Pulmonary Disease

## 2020-12-19 DIAGNOSIS — U071 COVID-19: Secondary | ICD-10-CM | POA: Diagnosis not present

## 2020-12-19 MED ORDER — DIPHENHYDRAMINE HCL 50 MG/ML IJ SOLN
50.0000 mg | Freq: Once | INTRAMUSCULAR | Status: DC | PRN
Start: 2020-12-19 — End: 2020-12-20

## 2020-12-19 MED ORDER — METHYLPREDNISOLONE SODIUM SUCC 125 MG IJ SOLR: 125.0000 mg | Freq: Once | INTRAMUSCULAR | Status: DC | PRN

## 2020-12-19 MED ORDER — EPINEPHRINE 0.3 MG/0.3ML IJ SOAJ
0.3000 mg | Freq: Once | INTRAMUSCULAR | Status: DC | PRN
Start: 2020-12-19 — End: 2020-12-20

## 2020-12-19 MED ORDER — ALBUTEROL SULFATE HFA 108 (90 BASE) MCG/ACT IN AERS: 2.0000 | INHALATION_SPRAY | Freq: Once | RESPIRATORY_TRACT | Status: DC | PRN

## 2020-12-19 MED ORDER — BEBTELOVIMAB 175 MG/2 ML IV (EUA)
175.0000 mg | Freq: Once | INTRAMUSCULAR | Status: AC
  Administered 2020-12-19: 175 mg via INTRAVENOUS

## 2020-12-19 MED ORDER — FAMOTIDINE IN NACL 20-0.9 MG/50ML-% IV SOLN: 20.0000 mg | Freq: Once | INTRAVENOUS | Status: DC | PRN

## 2020-12-19 MED ORDER — SODIUM CHLORIDE 0.9 % IV SOLN: INTRAVENOUS | Status: DC | PRN

## 2020-12-19 NOTE — Discharge Instructions (Signed)
10 Things You Can Do to Manage Your COVID-19 Symptoms at Home If you have possible or confirmed COVID-19: 1. Stay home except to get medical care. 2. Monitor your symptoms carefully. If your symptoms get worse, call your healthcare provider immediately. 3. Get rest and stay hydrated. 4. If you have a medical appointment, call the healthcare provider ahead of time and tell them that you have or may have COVID-19. 5. For medical emergencies, call 911 and notify the dispatch personnel that you have or may have COVID-19. 6. Cover your cough and sneezes with a tissue or use the inside of your elbow. 7. Wash your hands often with soap and water for at least 20 seconds or clean your hands with an alcohol-based hand sanitizer that contains at least 60% alcohol. 8. As much as possible, stay in a specific room and away from other people in your home. Also, you should use a separate bathroom, if available. If you need to be around other people in or outside of the home, wear a mask. 9. Avoid sharing personal items with other people in your household, like dishes, towels, and bedding. 10. Clean all surfaces that are touched often, like counters, tabletops, and doorknobs. Use household cleaning sprays or wipes according to the label instructions. cdc.gov/coronavirus 03/23/2020 This information is not intended to replace advice given to you by your health care provider. Make sure you discuss any questions you have with your health care provider. Document Revised: 07/09/2020 Document Reviewed: 07/09/2020 Elsevier Patient Education  2021 Elsevier Inc.  What types of side effects do monoclonal antibody drugs cause?  Common side effects  In general, the more common side effects caused by monoclonal antibody drugs include: . Allergic reactions, such as hives or itching . Flu-like signs and symptoms, including chills, fatigue, fever, and muscle aches and pains . Nausea, vomiting . Diarrhea . Skin  rashes . Low blood pressure   The CDC is recommending patients who receive monoclonal antibody treatments wait at least 90 days before being vaccinated.  Currently, there are no data on the safety and efficacy of mRNA COVID-19 vaccines in persons who received monoclonal antibodies or convalescent plasma as part of COVID-19 treatment. Based on the estimated half-life of such therapies as well as evidence suggesting that reinfection is uncommon in the 90 days after initial infection, vaccination should be deferred for at least 90 days, as a precautionary measure until additional information becomes available, to avoid interference of the antibody treatment with vaccine-induced immune responses.  

## 2020-12-19 NOTE — Progress Notes (Signed)
Diagnosis: COVID-19  Physician: Dr. Patrick Wright  Procedure: [facility] Med: Bebtelovimab Provided patient with Bebtelovimab fact sheet for patients, parents, and caregivers prior to infusion.   Complications: No immediate complications noted  Discharge: Discharged home    

## 2020-12-19 NOTE — Progress Notes (Signed)
Patient reviewed Fact Sheet for Patients, Parents, and Caregivers for Emergency Use Authorization (EUA) of Bebtelovimab for the Treatment of Coronavirus. Patient also reviewed and is agreeable to the estimated cost of treatment. Patient is agreeable to proceed.

## 2021-01-22 ENCOUNTER — Telehealth: Payer: Self-pay | Admitting: Neurology

## 2021-01-22 NOTE — Telephone Encounter (Signed)
Pt called stating that she is to have a procedure that requires her to stop taking her aspirin 81 MG chewable tablet for 3 days prior. Pt would like to know if a clearance form was received for her. Please advise.

## 2021-01-22 NOTE — Telephone Encounter (Signed)
I called pt.  Eyebrow microblading (tattoo) at beginning June 8.   Stop taking 1-3 days prior to procedure. Needs written note for them from provider.

## 2021-01-23 NOTE — Telephone Encounter (Signed)
I called pt and she will check with GYN or pcp re: aspirin.  I relayed that we do not manage that or would have prescribed for her.  We see for seizures.  She appreciated call back.

## 2021-01-23 NOTE — Telephone Encounter (Signed)
Yes I spoke to her about that initially and she was going to check with her other MD about it.

## 2021-01-28 ENCOUNTER — Telehealth: Payer: Self-pay

## 2021-01-28 ENCOUNTER — Encounter: Payer: Self-pay | Admitting: *Deleted

## 2021-01-28 NOTE — Telephone Encounter (Signed)
Pt called back  (she states her ob-gyn) stated that we placed on aspirin.   She stated that carolyn Hassell Done NP placed her on aspirin 81mg  po daily after she had her seizure 2006-2007.  Has been on since.  Needing letter to come off this for her eyebrow procedure.  Please advise.  Time sensitive issue as will in 2 wks.

## 2021-01-28 NOTE — Telephone Encounter (Signed)
Letter done and to SS/NP to sign.

## 2021-01-28 NOTE — Telephone Encounter (Signed)
Patient to have a microblading procedure and needs note from MD to d/c her ASA 81 mg prior to procedure. She called to see if gyn recommended she take ASA 81 mg daily.  I told her that is not something we usually recommend and I went back through her Epic chart to 2015 and it was always listed as a historical med so she has been on it for awhile.  I cannot assist her with who prescribed she take it daily.

## 2021-01-31 ENCOUNTER — Other Ambulatory Visit: Payer: Self-pay | Admitting: Family Medicine

## 2021-01-31 DIAGNOSIS — Z139 Encounter for screening, unspecified: Secondary | ICD-10-CM

## 2021-02-01 ENCOUNTER — Other Ambulatory Visit: Payer: Self-pay

## 2021-02-01 ENCOUNTER — Ambulatory Visit
Admission: RE | Admit: 2021-02-01 | Discharge: 2021-02-01 | Disposition: A | Discharge status: Home / Self Care | Payer: PPO | Source: Ambulatory Visit | Attending: Family Medicine | Admitting: Family Medicine

## 2021-02-01 DIAGNOSIS — Z1231 Encounter for screening mammogram for malignant neoplasm of breast: Secondary | ICD-10-CM | POA: Diagnosis not present

## 2021-02-01 DIAGNOSIS — Z139 Encounter for screening, unspecified: Secondary | ICD-10-CM

## 2021-05-03 ENCOUNTER — Other Ambulatory Visit: Payer: Self-pay | Admitting: Obstetrics and Gynecology

## 2021-05-03 NOTE — Telephone Encounter (Signed)
Annual due in Nov. 2022, Rx was filled today, no annual exam scheduled yet. Patient has 90 day supply now.

## 2021-05-20 ENCOUNTER — Encounter: Payer: Self-pay | Admitting: Adult Health

## 2021-05-20 ENCOUNTER — Ambulatory Visit: Payer: PPO | Admitting: Adult Health

## 2021-05-20 ENCOUNTER — Ambulatory Visit: Payer: PPO | Admitting: Neurology

## 2021-05-20 VITALS — BP 118/76 | HR 63 | Ht 69.0 in | Wt 169.0 lb

## 2021-05-20 DIAGNOSIS — G40309 Generalized idiopathic epilepsy and epileptic syndromes, not intractable, without status epilepticus: Secondary | ICD-10-CM

## 2021-05-20 NOTE — Patient Instructions (Addendum)
Continue Carbamazepine Blood work today If you have any seizure events please let us know.

## 2021-05-20 NOTE — Progress Notes (Signed)
I have read the note, and I agree with the clinical assessment and plan.  Sung Renton K Gesenia Bantz   

## 2021-05-20 NOTE — Progress Notes (Signed)
PATIENT: Tina Peck DOB: November 20, 1955  REASON FOR VISIT: follow up HISTORY FROM: patient  HISTORY OF PRESENT ILLNESS: Today 05/20/21:  Tina Peck is a 65 year female with a history of seizures.  She returns today for follow-up.  She had a single nocturnal generalized seizure in 2007.  She is currently on carbamazepine 200 mg 3 times a day.  She denies any recent seizure events.  She lives at home and is able to complete all ADLs independently.  She operates a Teacher, music without difficulty.  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  She returns today for an evaluation.  HISTORY  05/16/20 Tina Peck is a 65 year old female with history of single nocturnal generalized seizure in 2007.  She remains on Carbamazepine (Epitol), no further seizure activity.  EEG in the past was abnormal showing left frontotemporal epileptiform activity. Is doing overall well, tolerating medication.  Recently had URI was treated with prednisone and antibiotic.  She takes care of her grandchild a few days a week, she presents today for follow-up with him.  REVIEW OF SYSTEMS: Out of a complete 14 system review of symptoms, the patient complains only of the following symptoms, and all other reviewed systems are negative.  ALLERGIES: No Known Allergies  HOME MEDICATIONS: Outpatient Medications Prior to Visit  Medication Sig Dispense Refill   aspirin 81 MG chewable tablet Chew 81 mg by mouth daily.     carbamazepine (EPITOL) 200 MG tablet Take 1 tablet (200 mg total) by mouth 3 (three) times daily. 270 tablet 4   COLLAGEN PO Take by mouth daily. Powder mixes with milk  take daily 10.5 gram in scoop     polyethylene glycol (MIRALAX / GLYCOLAX) packet Take 17 g by mouth daily. 1/2 cap po daily     sertraline (ZOLOFT) 50 MG tablet Take 1 tablet (50 mg total) by mouth daily. 90 tablet 3   No facility-administered medications prior to visit.    PAST MEDICAL HISTORY: Past Medical History:   Diagnosis Date   Seizure ([place]) 2007   frontal lobe, only one seizure in 2007, none since   SVD (spontaneous vaginal delivery)    x 6    PAST SURGICAL HISTORY: Past Surgical History:  Procedure Laterality Date   ABDOMINAL HYSTERECTOMY N/A 03/04/2016   Procedure: HYSTERECTOMY ABDOMINAL;  Surgeon: Nunzio Cobbs, MD;  Location: [facility];  Service: Gynecology;  Laterality: N/A;  ANESTHESIA CONSULT NEEDED   ABDOMINAL SACROCOLPOPEXY N/A 03/04/2016   Procedure: ABDOMINO SACROCOLPOPEXY with HALBAN'S CULDOPLASTY;  Surgeon: Nunzio Cobbs, MD;  Location: [facility];  Service: Gynecology;  Laterality: N/A;   ANTERIOR AND POSTERIOR REPAIR N/A 03/04/2016   Procedure: ANTERIOR (CYSTOCELE) AND POSTERIOR REPAIR (RECTOCELE);  Surgeon: Nunzio Cobbs, MD;  Location: [facility];  Service: Gynecology;  Laterality: N/A;   BILATERAL SALPINGECTOMY  03/04/2016   Procedure: BILATERAL SALPINGECTOMY;  Surgeon: Nunzio Cobbs, MD;  Location: [facility];  Service: Gynecology;;   BLADDER SUSPENSION N/A 03/04/2016   Procedure: TRANSVAGINAL TAPE (TVT) PROCEDURE exact midurethral sling;  Surgeon: Nunzio Cobbs, MD;  Location: [facility];  Service: Gynecology;  Laterality: N/A;   COLONOSCOPY     normal   CYSTO N/A 03/04/2016   Procedure: CYSTO;  Surgeon: Nunzio Cobbs, MD;  Location: [facility];  Service: Gynecology;  Laterality: N/A;   DIAGNOSTIC LAPAROSCOPY Left 1986   LSO for dermoid cyst   OOPHORECTOMY  Left 1986   LSO-Dermoid   OOPHORECTOMY  03/04/2016   Procedure: RIGHT OOPHORECTOMY;  Surgeon: Nunzio Cobbs, MD;  Location: [facility];  Service: Gynecology;;   TONSILLECTOMY     as a child    FAMILY HISTORY: Family History  Problem Relation Age of Onset   Osteoarthritis Mother    Glaucoma Father 12   Seizures Father        developed very late in life    Spina bifida Brother    Cancer Maternal Grandfather    Diabetes Paternal Grandmother    Breast cancer Neg Hx      SOCIAL HISTORY: Social History   Socioeconomic History   Marital status: Married    Spouse name: Not on file   Number of children: 6   Years of education: 12th   Highest education level: Not on file  Occupational History   Occupation: homemaker  Tobacco Use   Smoking status: Never   Smokeless tobacco: Never  Vaping Use   Vaping Use: Never used  Substance and Sexual Activity   Alcohol use: No    Alcohol/week: 0.0 standard drinks   Drug use: No   Sexual activity: Yes    Partners: Male    Birth control/protection: Post-menopausal, Surgical    Comment: TAH/Bil.salpingectomy/RSO  Other Topics Concern   Not on file  Social History Narrative   Patient lives at home with her husband and has 6 children. 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    Right handed   Caffeine: 32 oz coffee and about 24 oz hot tea daily   Social Determinants of Health   Financial Resource Strain: Not on file  Food Insecurity: Not on file  Transportation Needs: Not on file  Physical Activity: Not on file  Stress: Not on file  Social Connections: Not on file  Intimate Partner Violence: Not on file      PHYSICAL EXAM  Vitals:   05/20/21 1026  BP: 118/76  Pulse: 63  Weight: 169 lb (76.7 kg)  Height: '5\' 9"'$  (1.753 m)   Body mass index is 24.96 kg/m.  Generalized: Well developed, in no acute distress   Neurological examination  Mentation: Alert oriented to time, place, history taking. Follows all commands speech and language fluent Cranial nerve II-XII: Pupils were equal round reactive to light. Extraocular movements were full, visual field were full on confrontational test. Facial sensation and strength were normal. Uvula tongue midline. Head turning and shoulder shrug  were normal and symmetric. Motor: The motor testing reveals 5 over 5 strength of all 4 extremities. Good symmetric motor tone is noted throughout.  Sensory: Sensory testing is intact to soft touch on all 4  extremities. No evidence of extinction is noted.  Coordination: Cerebellar testing reveals good finger-nose-finger and heel-to-shin bilaterally.  Gait and station: Gait is normal. Tandem gait is normal. Romberg is negative. No drift is seen.  Reflexes: Deep tendon reflexes are symmetric and normal bilaterally.   DIAGNOSTIC DATA (LABS, IMAGING, TESTING) - I reviewed patient records, labs, notes, testing and imaging myself where available.  Lab Results  Component Value Date   WBC 7.9 05/16/2020   HGB 13.9 05/16/2020   HCT 42.1 05/16/2020   MCV 91 05/16/2020   PLT 371 05/16/2020      Component Value Date/Time   NA 137 05/16/2020 0938   K 4.6 05/16/2020 0938   CL 99 05/16/2020 0938   CO2 25 05/16/2020 0938   GLUCOSE 96 05/16/2020  [id number]   GLUCOSE 117 (H) 03/05/2016 0542   BUN 8 05/16/2020 0938   CREATININE 0.58 05/16/2020 0938   CALCIUM 8.7 05/16/2020 0938   PROT 6.6 05/16/2020 0938   ALBUMIN 3.8 05/16/2020 0938   AST 23 05/16/2020 0938   ALT 41 (H) 05/16/2020 0938   ALKPHOS 157 (H) 05/16/2020 0938   BILITOT 0.3 05/16/2020 0938   GFRNONAA 96 05/16/2020 0938   GFRAA 111 05/16/2020 0938   Lab Results  Component Value Date   CHOL 213 (H) 07/11/2020   HDL 56 07/11/2020   LDLCALC 136 (H) 07/11/2020   TRIG 120 07/11/2020   CHOLHDL 3.8 07/11/2020   No results found for: HGBA1C No results found for: VITAMINB12 Lab Results  Component Value Date   TSH 0.80 06/04/2016      ASSESSMENT AND PLAN 65 y.o. year old female  has a past medical history of Seizure ([place]) (2007) and SVD (spontaneous vaginal delivery). here with:  1.  Seizures  --Continue carbamazepine 200 mg 3 times a day --Blood work today --Advised if she has any seizure event she should let us know --Follow-up in 1 year or sooner if needed     Ward Givens, MSN, NP-C 05/20/2021, 10:33 AM [facility] [address] [place], [place] [address] [phone]

## 2021-05-21 ENCOUNTER — Telehealth: Payer: Self-pay | Admitting: *Deleted

## 2021-05-21 LAB — COMPREHENSIVE METABOLIC PANEL
ALT: 15 IU/L (ref 0–32)
AST: 13 IU/L (ref 0–40)
Albumin/Globulin Ratio: 1.9 (ref 1.2–2.2)
Albumin: 4.5 g/dL (ref 3.8–4.8)
Alkaline Phosphatase: 143 IU/L — ABNORMAL HIGH (ref 44–121)
BUN/Creatinine Ratio: 24 (ref 12–28)
BUN: 14 mg/dL (ref 8–27)
Bilirubin Total: <0.2 mg/dL (ref 0.0–1.2)
CO2: 25 mmol/L (ref 20–29)
Calcium: 9.3 mg/dL (ref 8.7–10.3)
Chloride: 101 mmol/L (ref 96–106)
Creatinine, Ser: 0.58 mg/dL (ref 0.57–1.00)
Globulin, Total: 2.4 g/dL (ref 1.5–4.5)
Glucose: 102 mg/dL — ABNORMAL HIGH (ref 65–99)
Potassium: 4.6 mmol/L (ref 3.5–5.2)
Sodium: 140 mmol/L (ref 134–144)
Total Protein: 6.9 g/dL (ref 6.0–8.5)
eGFR: 99 mL/min/{1.73_m2} (ref >59)

## 2021-05-21 LAB — CBC WITH DIFFERENTIAL/PLATELET
Basophils Absolute: 0.1 10*3/uL (ref 0.0–0.2)
Basos: 1 %
EOS (ABSOLUTE): 0.1 10*3/uL (ref 0.0–0.4)
Eos: 2 %
Hematocrit: 42.2 % (ref 34.0–46.6)
Hemoglobin: 14.1 g/dL (ref 11.1–15.9)
Immature Grans (Abs): 0 10*3/uL (ref 0.0–0.1)
Immature Granulocytes: 0 %
Lymphocytes Absolute: 2.4 10*3/uL (ref 0.7–3.1)
Lymphs: 43 %
MCH: 30.3 pg (ref 26.6–33.0)
MCHC: 33.4 g/dL (ref 31.5–35.7)
MCV: 91 fL (ref 79–97)
Monocytes Absolute: 0.4 10*3/uL (ref 0.1–0.9)
Monocytes: 7 %
Neutrophils Absolute: 2.6 10*3/uL (ref 1.4–7.0)
Neutrophils: 47 %
Platelets: 306 10*3/uL (ref 150–450)
RBC: 4.66 x10E6/uL (ref 3.77–5.28)
RDW: 12.9 % (ref 11.7–15.4)
WBC: 5.6 10*3/uL (ref 3.4–10.8)

## 2021-05-21 LAB — CARBAMAZEPINE LEVEL, TOTAL: Carbamazepine (Tegretol), S: 4.4 ug/mL (ref 4.0–12.0)

## 2021-05-21 NOTE — Telephone Encounter (Signed)
Spoke with pt and advised lab work was reviewed by MM NP and she stated the labs are consistent with previous lab work, no concerns. Pt verbalized understanding and appreciation.

## 2021-05-21 NOTE — Telephone Encounter (Signed)
-----   Message from Ward Givens, NP sent at 05/21/2021  9:12 AM EDT ----- Consistent with previous lab work.  Please call patient with results

## 2021-07-25 ENCOUNTER — Other Ambulatory Visit: Payer: Self-pay | Admitting: Obstetrics and Gynecology

## 2021-07-25 ENCOUNTER — Other Ambulatory Visit: Payer: Self-pay

## 2021-07-25 NOTE — Telephone Encounter (Signed)
Patient called because no refill left on her Zoloft.  I called her and explained Rx good for one year and her last AEX was 07/11/20.  She is happy to schedule AEX and I will have front desk call her to arrange and then I will check on her refill for her.  She said  has several days worth of medication left.

## 2021-07-29 MED ORDER — SERTRALINE HCL 50 MG PO TABS: 50.0000 mg | ORAL_TABLET | Freq: Every day | ORAL | 0 refills | Status: DC

## 2021-07-29 NOTE — Telephone Encounter (Signed)
AEX scheduled 09/19/21 with Dr. Talbert Nan.

## 2021-08-12 ENCOUNTER — Other Ambulatory Visit: Payer: Self-pay | Admitting: Neurology

## 2021-08-14 DIAGNOSIS — H2513 Age-related nuclear cataract, bilateral: Secondary | ICD-10-CM | POA: Diagnosis not present

## 2021-09-16 NOTE — Progress Notes (Deleted)
66 y.o. [id number] Married White or Caucasian Not Hispanic or Latino female here for annual exam.      Patient's last menstrual period was 09/08/2006.          Sexually active: {yes no:314532}  The current method of family planning is {contraception:315051}.    Exercising: {yes no:314532}  {types:19826} Smoker:  {YES NO:22349}  Health Maintenance: Pap:   11-08-12 Neg:Neg HR HPV. Cervix at Hysterectomy - benign History of abnormal Pap:  no MMG:  02/05/21 Bi-rads 1 neg  BMD:   2019 per patient results normal  Colonoscopy: 12/2015 f/u 10 years  TDaP:  2016  Gardasil: none    reports that she has never smoked. She has never used smokeless tobacco. She reports that she does not drink alcohol and does not use drugs.  Past Medical History:  Diagnosis Date   Seizure ([place]) 2007   frontal lobe, only one seizure in 2007, none since   SVD (spontaneous vaginal delivery)    x 6    Past Surgical History:  Procedure Laterality Date   ABDOMINAL HYSTERECTOMY N/A 03/04/2016   Procedure: HYSTERECTOMY ABDOMINAL;  Surgeon: Nunzio Cobbs, MD;  Location: [facility];  Service: Gynecology;  Laterality: N/A;  ANESTHESIA CONSULT NEEDED   ABDOMINAL SACROCOLPOPEXY N/A 03/04/2016   Procedure: ABDOMINO SACROCOLPOPEXY with HALBAN'S CULDOPLASTY;  Surgeon: Nunzio Cobbs, MD;  Location: [facility];  Service: Gynecology;  Laterality: N/A;   ANTERIOR AND POSTERIOR REPAIR N/A 03/04/2016   Procedure: ANTERIOR (CYSTOCELE) AND POSTERIOR REPAIR (RECTOCELE);  Surgeon: Nunzio Cobbs, MD;  Location: [facility];  Service: Gynecology;  Laterality: N/A;   BILATERAL SALPINGECTOMY  03/04/2016   Procedure: BILATERAL SALPINGECTOMY;  Surgeon: Nunzio Cobbs, MD;  Location: [facility];  Service: Gynecology;;   BLADDER SUSPENSION N/A 03/04/2016   Procedure: TRANSVAGINAL TAPE (TVT) PROCEDURE exact midurethral sling;  Surgeon: Nunzio Cobbs, MD;  Location: [facility];  Service: Gynecology;  Laterality: N/A;    COLONOSCOPY     normal   CYSTO N/A 03/04/2016   Procedure: CYSTO;  Surgeon: Nunzio Cobbs, MD;  Location: [facility];  Service: Gynecology;  Laterality: N/A;   DIAGNOSTIC LAPAROSCOPY Left 1986   LSO for dermoid cyst   OOPHORECTOMY Left 1986   LSO-Dermoid   OOPHORECTOMY  03/04/2016   Procedure: RIGHT OOPHORECTOMY;  Surgeon: Nunzio Cobbs, MD;  Location: [facility];  Service: Gynecology;;   TONSILLECTOMY     as a child    Current Outpatient Medications  Medication Sig Dispense Refill   aspirin 81 MG chewable tablet Chew 81 mg by mouth daily.     carbamazepine (TEGRETOL) 200 MG tablet TAKE ONE TABLET BY MOUTH THREE TIMES DAILY 270 tablet 4   COLLAGEN PO Take by mouth daily. Powder mixes with milk  take daily 10.5 gram in scoop     polyethylene glycol (MIRALAX / GLYCOLAX) packet Take 17 g by mouth daily. 1/2 cap po daily     sertraline (ZOLOFT) 50 MG tablet Take 1 tablet (50 mg total) by mouth daily. 90 tablet 0   No current facility-administered medications for this visit.    Family History  Problem Relation Age of Onset   Osteoarthritis Mother    Glaucoma Father 86   Seizures Father        developed very late in life    Spina bifida Brother    Cancer Maternal Grandfather    Diabetes Paternal  Grandmother    Breast cancer Neg Hx     Review of Systems  Exam:   LMP 09/08/2006   Weight change: @WEIGHTCHANGE @ Height:      Ht Readings from Last 3 Encounters:  05/20/21 5\' 9"  (1.753 m)  07/11/20 5' 8.5" (1.74 m)  05/16/20 5\' 9"  (1.753 m)    General appearance: alert, cooperative and appears stated age Head: Normocephalic, without obvious abnormality, atraumatic Neck: no adenopathy, supple, symmetrical, trachea midline and thyroid {CHL AMB PHY EX THYROID NORM [phone]::"normal to inspection and palpation"} Lungs: clear to auscultation bilaterally Cardiovascular: regular rate and rhythm Breasts: {Exam; breast:13139::"normal appearance, no masses or  tenderness"} Abdomen: soft, non-tender; non distended,  no masses,  no organomegaly Extremities: extremities normal, atraumatic, no cyanosis or edema Skin: Skin color, texture, turgor normal. No rashes or lesions Lymph nodes: Cervical, supraclavicular, and axillary nodes normal. No abnormal inguinal nodes palpated Neurologic: Grossly normal   Pelvic: External genitalia:  no lesions              Urethra:  normal appearing urethra with no masses, tenderness or lesions              Bartholins and Skenes: normal                 Vagina: normal appearing vagina with normal color and discharge, no lesions              Cervix: {CHL AMB PHY EX CERVIX NORM DEFAULT:[phone]::"no lesions"}               Bimanual Exam:  Uterus:  {CHL AMB PHY EX UTERUS NORM [phone]::"normal size, contour, position, consistency, mobility, non-tender"}              Adnexa: {CHL AMB PHY EX ADNEXA NO MASS DEFAULT:[phone]::"no mass, fullness, tenderness"}               Rectovaginal: Confirms               Anus:  normal sphincter tone, no lesions  *** chaperoned for the exam.  A:  Well Woman with normal exam  P:

## 2021-09-19 ENCOUNTER — Ambulatory Visit: Payer: PPO | Admitting: Obstetrics and Gynecology

## 2021-10-18 DIAGNOSIS — H2512 Age-related nuclear cataract, left eye: Secondary | ICD-10-CM | POA: Diagnosis not present

## 2021-10-18 DIAGNOSIS — H25013 Cortical age-related cataract, bilateral: Secondary | ICD-10-CM | POA: Diagnosis not present

## 2021-10-18 DIAGNOSIS — H2513 Age-related nuclear cataract, bilateral: Secondary | ICD-10-CM | POA: Diagnosis not present

## 2021-10-18 DIAGNOSIS — H18413 Arcus senilis, bilateral: Secondary | ICD-10-CM | POA: Diagnosis not present

## 2021-10-18 DIAGNOSIS — H25043 Posterior subcapsular polar age-related cataract, bilateral: Secondary | ICD-10-CM | POA: Diagnosis not present

## 2021-11-08 ENCOUNTER — Ambulatory Visit: Payer: PPO | Admitting: Obstetrics and Gynecology

## 2021-11-19 ENCOUNTER — Telehealth: Payer: Self-pay | Admitting: *Deleted

## 2021-11-19 MED ORDER — SERTRALINE HCL 50 MG PO TABS: 50.0000 mg | ORAL_TABLET | Freq: Every day | ORAL | 0 refills | Status: DC

## 2021-11-19 NOTE — Telephone Encounter (Signed)
[place] for refill of Zoloft 50 mg daily.  #30 RF none.  ? ? ? ?

## 2021-11-19 NOTE — Telephone Encounter (Signed)
Dr.Jertson patient.  ? ?Last annual exam was 07/2020 ?Annual exam scheduled 4/14/20203 ?

## 2021-11-19 NOTE — Telephone Encounter (Signed)
Left message on voicemail Rx sent.  

## 2021-12-17 NOTE — Progress Notes (Signed)
66 y.o. [id number] Married White or Caucasian Not Hispanic or Latino female here for annual exam.  H/O TAH/sacrocolopexy/TVT in 2017. Sexually active, some discomfort, uses a lubricant. Doesn't want the vaginal estrogen.  ? ?H/O Depression. Son died 5 years ago. She is in a grief group. She is on Zoloft, considering going off. Wants to try a lower dose.  ?  ? ?Patient's last menstrual period was 09/08/2006.          ?Sexually active: Yes.    ?The current method of family planning is post menopausal status.    ?Exercising: Yes.     Walk ?Smoker:  no ? ?Health Maintenance: ?Pap:  11-08-12 Normal Neg HR HPV ?History of abnormal Pap:  no ?MMG:  02-01-21 normal Bi Rad 1 ?BMD:   Yrs.ago -normal per pt. ?Colonoscopy: 2017 normal -10 yr follow up ?TDaP:  2016 ?Gardasil:N/A ? ? reports that she has never smoked. She has never used smokeless tobacco. She reports that she does not drink alcohol and does not use drugs.She has 5 living children, 5 grandchildren. One grandchild due in July. She watches 2 of her grandchildren. One daughter is special needs and lives with her.  ? ?Past Medical History:  ?Diagnosis Date  ? Seizure [facility] [address]) 2007  ? frontal lobe, only one seizure in 2007, none since  ? SVD (spontaneous vaginal delivery)   ? x 6  ? ? ?Past Surgical History:  ?Procedure Laterality Date  ? ABDOMINAL HYSTERECTOMY N/A 03/04/2016  ? Procedure: HYSTERECTOMY ABDOMINAL;  Surgeon: Nunzio Cobbs, MD;  Location: [facility];  Service: Gynecology;  Laterality: N/A;  ANESTHESIA CONSULT NEEDED  ? ABDOMINAL SACROCOLPOPEXY N/A 03/04/2016  ? Procedure: ABDOMINO SACROCOLPOPEXY with HALBAN'S CULDOPLASTY;  Surgeon: Nunzio Cobbs, MD;  Location: [facility];  Service: Gynecology;  Laterality: N/A;  ? ANTERIOR AND POSTERIOR REPAIR N/A 03/04/2016  ? Procedure: ANTERIOR (CYSTOCELE) AND POSTERIOR REPAIR (RECTOCELE);  Surgeon: Nunzio Cobbs, MD;  Location: [facility];  Service: Gynecology;  Laterality: N/A;  ? BILATERAL SALPINGECTOMY   03/04/2016  ? Procedure: BILATERAL SALPINGECTOMY;  Surgeon: Nunzio Cobbs, MD;  Location: [facility];  Service: Gynecology;;  ? BLADDER SUSPENSION N/A 03/04/2016  ? Procedure: TRANSVAGINAL TAPE (TVT) PROCEDURE exact midurethral sling;  Surgeon: Nunzio Cobbs, MD;  Location: [facility];  Service: Gynecology;  Laterality: N/A;  ? COLONOSCOPY    ? normal  ? CYSTO N/A 03/04/2016  ? Procedure: CYSTO;  Surgeon: Nunzio Cobbs, MD;  Location: [facility];  Service: Gynecology;  Laterality: N/A;  ? DIAGNOSTIC LAPAROSCOPY Left 1986  ? LSO for dermoid cyst  ? OOPHORECTOMY Left 1986  ? LSO-Dermoid  ? OOPHORECTOMY  03/04/2016  ? Procedure: RIGHT OOPHORECTOMY;  Surgeon: Nunzio Cobbs, MD;  Location: [facility];  Service: Gynecology;;  ? TONSILLECTOMY    ? as a child  ? ? ?Current Outpatient Medications  ?Medication Sig Dispense Refill  ? aspirin 81 MG chewable tablet Chew 81 mg by mouth daily.    ? carbamazepine (TEGRETOL) 200 MG tablet TAKE ONE TABLET BY MOUTH THREE TIMES DAILY 270 tablet 4  ? COLLAGEN PO Take by mouth daily. Powder mixes with milk  take daily 10.5 gram in scoop    ? polyethylene glycol (MIRALAX / GLYCOLAX) packet Take 17 g by mouth daily. 1/2 cap po daily    ? sertraline (ZOLOFT) 50 MG tablet Take 1 tablet (50 mg total) by mouth daily.  30 tablet 0  ? ?No current facility-administered medications for this visit.  ? ? ?Family History  ?Problem Relation Age of Onset  ? Osteoarthritis Mother   ? Glaucoma Father 76  ? Seizures Father   ?     developed very late in life   ? Spina bifida Brother   ? Cancer Maternal Grandfather   ? Diabetes Paternal Grandmother   ? Breast cancer Neg Hx   ? ? ?Review of Systems  ?All other systems reviewed and are negative. ? ?Exam:   ?BP 120/78 (BP Location: Right Arm, Patient Position: Sitting, Cuff Size: Normal)   Pulse 78   Ht 5' 8.5" (1.74 m)   Wt 166 lb (75.3 kg)   LMP 09/08/2006   SpO2 98%   BMI 24.87 kg/m?   Weight change: '@WEIGHTCHANGE'$ @ Height:    Height: 5' 8.5" (174 cm)  ?Ht Readings from Last 3 Encounters:  ?12/20/21 5' 8.5" (1.74 m)  ?05/20/21 '5\' 9"'$  (1.753 m)  ?07/11/20 5' 8.5" (1.74 m)  ? ? ?General appearance: alert, cooperative and appears stated age ?Head: Normocephalic, without obvious abnormality, atraumatic ?Neck: no adenopathy, supple, symmetrical, trachea midline and thyroid normal to inspection and palpation ?Breasts: normal appearance, no masses or tenderness ?Abdomen: soft, non-tender; non distended,  no masses,  no organomegaly ?Extremities: extremities normal, atraumatic, no cyanosis or edema ?Skin: Skin color, texture, turgor normal. No rashes or lesions ?Lymph nodes: Cervical, supraclavicular, and axillary nodes normal. ?No abnormal inguinal nodes palpated ?Neurologic: Grossly normal ? ? ?Pelvic: External genitalia:  no lesions ?             Urethra:  normal appearing urethra with no masses, tenderness or lesions ?             Bartholins and Skenes: normal    ?             Vagina: normal appearing vagina with normal color and discharge, no lesions ?             Cervix: absent ?              ?Bimanual Exam:  Uterus:  uterus absent ?             Adnexa: no mass, fullness, tenderness ?              Rectovaginal: Confirms ?              Anus:  normal sphincter tone, no lesions ? ?Caryn Bee, CMA chaperoned for the exam. ? ?1. Well woman exam ?No pap needed ?Mammogram in 5/23 ?Colonoscopy UTD ?Discussed breast self exam ?Discussed calcium and vit D intake ? ?2. History of depression ?Doing better, wants to cut back on medication and maybe wean off ?- sertraline (ZOLOFT) 25 MG tablet; Take 1 tablet (25 mg total) by mouth daily.  Dispense: 90 tablet; Refill: 3 ? ?3. Hypoestrogenism ?- DG Bone Density; Future ? ?  ?

## 2021-12-20 ENCOUNTER — Encounter: Payer: Self-pay | Admitting: Obstetrics and Gynecology

## 2021-12-20 ENCOUNTER — Ambulatory Visit (INDEPENDENT_AMBULATORY_CARE_PROVIDER_SITE_OTHER): Payer: PPO | Admitting: Obstetrics and Gynecology

## 2021-12-20 VITALS — BP 120/78 | HR 78 | Ht 68.5 in | Wt 166.0 lb

## 2021-12-20 DIAGNOSIS — Z8659 Personal history of other mental and behavioral disorders: Secondary | ICD-10-CM

## 2021-12-20 DIAGNOSIS — Z01419 Encounter for gynecological examination (general) (routine) without abnormal findings: Secondary | ICD-10-CM

## 2021-12-20 DIAGNOSIS — E2839 Other primary ovarian failure: Secondary | ICD-10-CM

## 2021-12-20 MED ORDER — SERTRALINE HCL 25 MG PO TABS: 25.0000 mg | ORAL_TABLET | Freq: Every day | ORAL | 3 refills | Status: DC

## 2021-12-20 NOTE — Patient Instructions (Addendum)
Try uberlube for vaginal lubrication  EXERCISE   We recommended that you start or continue a regular exercise program for good health. Physical activity is anything that gets your body moving, some is better than none. The CDC recommends 150 minutes per week of Moderate-Intensity Aerobic Activity and 2 or more days of Muscle Strengthening Activity.  Benefits of exercise are limitless: helps weight loss/weight maintenance, improves mood and energy, helps with depression and anxiety, improves sleep, tones and strengthens muscles, improves balance, improves bone density, protects from chronic conditions such as heart disease, high blood pressure and diabetes and so much more. To learn more visit: https://www.cdc.gov/physicalactivity/index.html  DIET: Good nutrition starts with a healthy diet of fruits, vegetables, whole grains, and lean protein sources. Drink plenty of water for hydration. Minimize empty calories, sodium, sweets. For more information about dietary recommendations visit: https://health.gov/our-work/nutrition-physical-activity/dietary-guidelines and https://www.myplate.gov/  ALCOHOL:  Women should limit their alcohol intake to no more than 7 drinks/beers/glasses of wine (combined, not each!) per week. Moderation of alcohol intake to this level decreases your risk of breast cancer and liver damage.  If you are concerned that you may have a problem, or your friends have told you they are concerned about your drinking, there are many resources to help. A well-known program that is free, effective, and available to all people all over the nation is Alcoholics Anonymous.  Check out this site to learn more: https://www.aa.org/   CALCIUM AND VITAMIN D:  Adequate intake of calcium and Vitamin D are recommended for bone health.  You should be getting between 1000-1200 mg of calcium and 800 units of Vitamin D daily between diet and supplements  MAMMOGRAMS:  All women over 40 years old should have a  routine mammogram.   COLON CANCER SCREENING: Now recommend starting at age 45. At this time colonoscopy is not covered for routine screening until 50. There are take home tests that can be done between 45-49.   COLONOSCOPY:  Colonoscopy to screen for colon cancer is recommended for all women at age 50.  We know, you hate the idea of the prep.  We agree, BUT, having colon cancer and not knowing it is worse!!  Colon cancer so often starts as a polyp that can be seen and removed at colonscopy, which can quite literally save your life!  And if your first colonoscopy is normal and you have no family history of colon cancer, most women don't have to have it again for 10 years.  Once every ten years, you can do something that may end up saving your life, right?  We will be happy to help you get it scheduled when you are ready.  Be sure to check your insurance coverage so you understand how much it will cost.  It may be covered as a preventative service at no cost, but you should check your particular policy.      Breast Self-Awareness Breast self-awareness means being familiar with how your breasts look and feel. It involves checking your breasts regularly and reporting any changes to your health care provider. Practicing breast self-awareness is important. A change in your breasts can be a sign of a serious medical problem. Being familiar with how your breasts look and feel allows you to find any problems early, when treatment is more likely to be successful. All women should practice breast self-awareness, including women who have had breast implants. How to do a breast self-exam One way to learn what is normal for your breasts and whether   your breasts are changing is to do a breast self-exam. To do a breast self-exam: Look for Changes  Remove all the clothing above your waist. Stand in front of a mirror in a room with good lighting. Put your hands on your hips. Push your hands firmly downward. Compare  your breasts in the mirror. Look for differences between them (asymmetry), such as: Differences in shape. Differences in size. Puckers, dips, and bumps in one breast and not the other. Look at each breast for changes in your skin, such as: Redness. Scaly areas. Look for changes in your nipples, such as: Discharge. Bleeding. Dimpling. Redness. A change in position. Feel for Changes Carefully feel your breasts for lumps and changes. It is best to do this while lying on your back on the floor and again while sitting or standing in the shower or tub with soapy water on your skin. Feel each breast in the following way: Place the arm on the side of the breast you are examining above your head. Feel your breast with the other hand. Start in the nipple area and make  inch (2 cm) overlapping circles to feel your breast. Use the pads of your three middle fingers to do this. Apply light pressure, then medium pressure, then firm pressure. The light pressure will allow you to feel the tissue closest to the skin. The medium pressure will allow you to feel the tissue that is a little deeper. The firm pressure will allow you to feel the tissue close to the ribs. Continue the overlapping circles, moving downward over the breast until you feel your ribs below your breast. Move one finger-width toward the center of the body. Continue to use the  inch (2 cm) overlapping circles to feel your breast as you move slowly up toward your collarbone. Continue the up and down exam using all three pressures until you reach your armpit.  Write Down What You Find  Write down what is normal for each breast and any changes that you find. Keep a written record with breast changes or normal findings for each breast. By writing this information down, you do not need to depend only on memory for size, tenderness, or location. Write down where you are in your menstrual cycle, if you are still menstruating. If you are having  trouble noticing differences in your breasts, do not get discouraged. With time you will become more familiar with the variations in your breasts and more comfortable with the exam. How often should I examine my breasts? Examine your breasts every month. If you are breastfeeding, the best time to examine your breasts is after a feeding or after using a breast pump. If you menstruate, the best time to examine your breasts is 5-7 days after your period is over. During your period, your breasts are lumpier, and it may be more difficult to notice changes. When should I see my health care provider? See your health care provider if you notice: A change in shape or size of your breasts or nipples. A change in the skin of your breast or nipples, such as a reddened or scaly area. Unusual discharge from your nipples. A lump or thick area that was not there before. Pain in your breasts. Anything that concerns you.  

## 2022-01-10 DIAGNOSIS — H2513 Age-related nuclear cataract, bilateral: Secondary | ICD-10-CM | POA: Diagnosis not present

## 2022-01-10 DIAGNOSIS — H2512 Age-related nuclear cataract, left eye: Secondary | ICD-10-CM | POA: Diagnosis not present

## 2022-01-10 DIAGNOSIS — H2511 Age-related nuclear cataract, right eye: Secondary | ICD-10-CM | POA: Diagnosis not present

## 2022-01-24 DIAGNOSIS — H2513 Age-related nuclear cataract, bilateral: Secondary | ICD-10-CM | POA: Diagnosis not present

## 2022-01-24 DIAGNOSIS — H2511 Age-related nuclear cataract, right eye: Secondary | ICD-10-CM | POA: Diagnosis not present

## 2022-01-30 ENCOUNTER — Other Ambulatory Visit: Payer: Self-pay | Admitting: Family Medicine

## 2022-01-30 DIAGNOSIS — Z139 Encounter for screening, unspecified: Secondary | ICD-10-CM

## 2022-02-25 DIAGNOSIS — L03114 Cellulitis of left upper limb: Secondary | ICD-10-CM | POA: Diagnosis not present

## 2022-05-26 ENCOUNTER — Telehealth: Payer: PPO | Admitting: Neurology

## 2022-05-28 ENCOUNTER — Telehealth (INDEPENDENT_AMBULATORY_CARE_PROVIDER_SITE_OTHER): Payer: PPO | Admitting: Neurology

## 2022-05-28 DIAGNOSIS — G40309 Generalized idiopathic epilepsy and epileptic syndromes, not intractable, without status epilepticus: Secondary | ICD-10-CM

## 2022-05-28 MED ORDER — CARBAMAZEPINE 200 MG PO TABS: 200.0000 mg | ORAL_TABLET | Freq: Three times a day (TID) | ORAL | 4 refills | Status: DC

## 2022-05-28 NOTE — Progress Notes (Signed)
   Virtual Visit via Video Note  I connected with Jamiyah Dingley on 05/28/22 at  3:15 PM EDT by a video enabled telemedicine application and verified that I am speaking with the correct person using two identifiers.  Location: Patient: at her home Provider: in the office    I discussed the limitations of evaluation and management by telemedicine and the availability of in person appointments. The patient expressed understanding and agreed to proceed.  History of Present Illness: 05/28/22 SS: Jalea is here today for follow-up. Doing well, remains on carbamazepine 200 mg 3 times daily. No issues. Only 1 seizure in 2007. Has been told in the past seizure activity was seen on EEG. No health issues. Drives a car. Labs last year showed carbamazepine 4.4, alk phos 143, glucose 102. She takes of her grand kids during the day. Has not been to PCP in a long time.   05/20/21 MM: Ms. Shambaugh is a 66 year female with a history of seizures.  She returns today for follow-up.  She had a single nocturnal generalized seizure in 2007.  She is currently on carbamazepine 200 mg 3 times a day.  She denies any recent seizure events.  She lives at home and is able to complete all ADLs independently.  She operates a Teacher, music without difficulty.  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  She returns today for an evaluation.   Observations/Objective: Alert and oriented, speech is clear and concise, facial symmetry noted, moves all extremities freely  Assessment and Plan: Seizures   -Doing well, no recent seizures -Continue carbamazepine 200 mg 3 times daily -Reviewed blood work from last year, will update labs, she will come at her convenience -Follow up in 1 year or sooner if needed   Follow Up Instructions: 1 year virtual visit    I discussed the assessment and treatment plan with the patient. The patient was provided an opportunity to ask questions and all were answered. The patient agreed  with the plan and demonstrated an understanding of the instructions.   The patient was advised to call back or seek an in-person evaluation if the symptoms worsen or if the condition fails to improve as anticipated.  Evangeline Dakin, DNP  [facility] [address] [place], [place] [address] [phone]

## 2022-05-28 NOTE — Patient Instructions (Signed)
It was great to see you today, you look great! Please, your convenience for labs, Monday through Thursday 8-5 We will continue carbamazepine for seizure prevention I will see back in 1 year or sooner if needed

## 2022-06-23 ENCOUNTER — Other Ambulatory Visit: Payer: PPO

## 2022-07-18 DIAGNOSIS — R1310 Dysphagia, unspecified: Secondary | ICD-10-CM | POA: Diagnosis not present

## 2022-08-06 DIAGNOSIS — R1310 Dysphagia, unspecified: Secondary | ICD-10-CM | POA: Diagnosis not present

## 2022-10-03 DIAGNOSIS — R1310 Dysphagia, unspecified: Secondary | ICD-10-CM | POA: Diagnosis not present

## 2022-10-03 DIAGNOSIS — B3781 Candidal esophagitis: Secondary | ICD-10-CM | POA: Diagnosis not present

## 2022-10-03 DIAGNOSIS — K2289 Other specified disease of esophagus: Secondary | ICD-10-CM | POA: Diagnosis not present

## 2022-10-08 ENCOUNTER — Other Ambulatory Visit: Payer: Self-pay | Admitting: Obstetrics and Gynecology

## 2022-10-08 DIAGNOSIS — Z8659 Personal history of other mental and behavioral disorders: Secondary | ICD-10-CM

## 2022-10-09 NOTE — Telephone Encounter (Signed)
Refill request received for sertraline '25mg'$ .  Last AEX 12/20/21.  Advise if ok for refills.

## 2022-11-07 DIAGNOSIS — R1310 Dysphagia, unspecified: Secondary | ICD-10-CM | POA: Diagnosis not present

## 2022-11-07 DIAGNOSIS — Z0000 Encounter for general adult medical examination without abnormal findings: Secondary | ICD-10-CM | POA: Diagnosis not present

## 2022-11-07 DIAGNOSIS — Z1211 Encounter for screening for malignant neoplasm of colon: Secondary | ICD-10-CM | POA: Diagnosis not present

## 2022-11-07 DIAGNOSIS — Z1239 Encounter for other screening for malignant neoplasm of breast: Secondary | ICD-10-CM | POA: Diagnosis not present

## 2022-11-07 DIAGNOSIS — Z23 Encounter for immunization: Secondary | ICD-10-CM | POA: Diagnosis not present

## 2022-11-07 DIAGNOSIS — Z136 Encounter for screening for cardiovascular disorders: Secondary | ICD-10-CM | POA: Diagnosis not present

## 2022-11-07 DIAGNOSIS — L989 Disorder of the skin and subcutaneous tissue, unspecified: Secondary | ICD-10-CM | POA: Diagnosis not present

## 2022-11-07 DIAGNOSIS — F3342 Major depressive disorder, recurrent, in full remission: Secondary | ICD-10-CM | POA: Diagnosis not present

## 2022-11-07 DIAGNOSIS — Z13820 Encounter for screening for osteoporosis: Secondary | ICD-10-CM | POA: Diagnosis not present

## 2022-11-07 DIAGNOSIS — R569 Unspecified convulsions: Secondary | ICD-10-CM | POA: Diagnosis not present

## 2022-11-14 ENCOUNTER — Ambulatory Visit
Admission: RE | Admit: 2022-11-14 | Discharge: 2022-11-14 | Disposition: A | Discharge status: Home / Self Care | Payer: PPO | Source: Ambulatory Visit | Attending: Obstetrics and Gynecology | Admitting: Obstetrics and Gynecology

## 2022-11-14 DIAGNOSIS — Z780 Asymptomatic menopausal state: Secondary | ICD-10-CM | POA: Diagnosis not present

## 2022-11-14 DIAGNOSIS — M85851 Other specified disorders of bone density and structure, right thigh: Secondary | ICD-10-CM | POA: Diagnosis not present

## 2022-11-14 DIAGNOSIS — E2839 Other primary ovarian failure: Secondary | ICD-10-CM

## 2022-11-14 DIAGNOSIS — M810 Age-related osteoporosis without current pathological fracture: Secondary | ICD-10-CM | POA: Diagnosis not present

## 2022-11-19 ENCOUNTER — Telehealth: Payer: Self-pay | Admitting: *Deleted

## 2022-11-19 NOTE — Telephone Encounter (Signed)
Call returned to patient. Questions answered regarding previous documented Hx of depression and bereavement. Patient current on zoloft and is planning to wean off.   Patient requesting Thursday or Friday early morning appt for BMD consult and 1 yer med f/u with Dr. Quincy Simmonds or Dr. Talbert Nan. OV scheduled with Dr. Quincy Simmonds for 4/18 at 8am.   Questions answered. Patient appreciative of follow-up.   Routing to provider for final review. Patient is agreeable to disposition. Will close encounter.  Cc: Dr. Quincy Simmonds

## 2022-12-11 DIAGNOSIS — R1310 Dysphagia, unspecified: Secondary | ICD-10-CM | POA: Diagnosis not present

## 2022-12-11 DIAGNOSIS — B3781 Candidal esophagitis: Secondary | ICD-10-CM | POA: Diagnosis not present

## 2022-12-11 NOTE — Progress Notes (Signed)
GYNECOLOGY  VISIT   HPI: 67 y.o.   Married  Caucasian  female   [phone] with Patient's last menstrual period was 09/08/2006.   here for   bone density and follow up.   BMD 11/14/22 showing osteoporosis.  T score of spine -2.8. T score of right femur -2.2. Takes vitamins.  Cares for grandchildren.  Does vigorous walking.   Has severe swallowing issues for many years.  Had endoscopy in January.   Dx with fungal infection of the esophagus.  Does have some reflux in the last week or so.   Not a smoker.  No ETOH.   May have removal of upper teeth and dentures placed at some time in the future.  No specific timeline for this.    Having issues with cellulitis following skin disruptions from scrapes in skin.   Mother had osteoporosis.   Patient also has concerns about medical diagnosis of "history of depression" in her chart.  Patient denies this history and is asking for correction.   She started Zoloft in 2018 due to bereavement.   GYNECOLOGIC HISTORY: Patient's last menstrual period was 09/08/2006. 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   Prolapse of uterus 12/29/2012   Cystocele 12/29/2012   Seizure     Past Medical History:  Diagnosis Date   Seizure 2007   frontal lobe, only one seizure in 2007, none since   SVD (spontaneous vaginal delivery)    x 6    Past Surgical History:  Procedure Laterality Date   ABDOMINAL HYSTERECTOMY N/A 03/04/2016   Procedure:  HYSTERECTOMY ABDOMINAL;  Surgeon: Patton Salles, MD;  Location: [facility];  Service: Gynecology;  Laterality: N/A;  ANESTHESIA CONSULT NEEDED   ABDOMINAL SACROCOLPOPEXY N/A 03/04/2016   Procedure: ABDOMINO SACROCOLPOPEXY with HALBAN'S CULDOPLASTY;  Surgeon: Patton Salles, MD;  Location: [facility];  Service: Gynecology;  Laterality: N/A;   ANTERIOR AND POSTERIOR REPAIR N/A 03/04/2016   Procedure: ANTERIOR (CYSTOCELE) AND POSTERIOR REPAIR (RECTOCELE);  Surgeon: Patton Salles, MD;  Location: [facility];  Service: Gynecology;  Laterality: N/A;   BILATERAL SALPINGECTOMY  03/04/2016   Procedure: BILATERAL SALPINGECTOMY;  Surgeon: Patton Salles, MD;  Location: [facility];  Service: Gynecology;;   BLADDER SUSPENSION N/A 03/04/2016   Procedure: TRANSVAGINAL TAPE (TVT) PROCEDURE exact midurethral sling;  Surgeon: Patton Salles, MD;  Location: [facility];  Service: Gynecology;  Laterality: N/A;   COLONOSCOPY     normal   CYSTO N/A 03/04/2016   Procedure: CYSTO;  Surgeon: Patton Salles, MD;  Location: [facility];  Service: Gynecology;  Laterality: N/A;  DIAGNOSTIC LAPAROSCOPY Left 1986   LSO for dermoid cyst   OOPHORECTOMY Left 1986   LSO-Dermoid   OOPHORECTOMY  03/04/2016   Procedure: RIGHT OOPHORECTOMY;  Surgeon: Patton Salles, MD;  Location: [facility];  Service: Gynecology;;   TONSILLECTOMY     as a child    Current Outpatient Medications  Medication Sig Dispense Refill   aspirin 81 MG chewable tablet Chew 81 mg by mouth daily.     carbamazepine (TEGRETOL) 200 MG tablet Take 1 tablet (200 mg total) by mouth 3 (three) times daily. 270 tablet 4   COLLAGEN PO Take by mouth daily. Powder mixes with milk  take daily 10.5 gram in scoop     polyethylene glycol (MIRALAX / GLYCOLAX) packet Take 17 g by mouth daily. 1/2 cap po daily     No current facility-administered medications for this visit.     ALLERGIES: Patient has no known allergies.  Family  History  Problem Relation Age of Onset   Osteoarthritis Mother    Glaucoma Father 23   Seizures Father        developed very late in life    Spina bifida Brother    Cancer Maternal Grandfather    Diabetes Paternal Grandmother    Breast cancer Neg Hx     Social History   Socioeconomic History   Marital status: Married    Spouse name: Not on file   Number of children: 6   Years of education: 12th   Highest education level: Not on file  Occupational History   Occupation: homemaker  Tobacco Use   Smoking status: Never   Smokeless tobacco: Never  Vaping Use   Vaping Use: Never used  Substance and Sexual Activity   Alcohol use: No    Alcohol/week: 0.0 standard drinks of alcohol   Drug use: No   Sexual activity: Yes    Partners: Male    Birth control/protection: Post-menopausal, Surgical    Comment: TAH/Bil.salpingectomy/RSO-1st intercourse 67 yo-Fewer than 5 partners  Other Topics Concern   Not on file  Social History Narrative   Patient lives at home with her husband and has 6 children. 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    Right handed   Caffeine: 32 oz coffee and about 24 oz hot tea daily   Social Determinants of Health   Financial Resource Strain: Not on file  Food Insecurity: Not on file  Transportation Needs: Not on file  Physical Activity: Not on file  Stress: Not on file  Social Connections: Not on file  Intimate Partner Violence: Not on file    Review of Systems  All other systems reviewed and are negative.   PHYSICAL EXAMINATION:    BP 126/80 (BP Location: Left Arm, Patient Position: Sitting, Cuff Size: Normal)   Ht 5\' 9"  (1.753 m)   LMP 09/08/2006   BMI 24.51 kg/m     General appearance: alert, cooperative and appears stated age  ASSESSMENT  Osteoporosis.  Hx esophagitis.  Hx cellulitis. Dental issues.  Medical record review.  Hx bereavement.   PLAN  We had a comprehensive discussion of osteoporosis, risk factors,  increased risk of fracture, and treatment options.  Calcium 1200 mg daily in divided doses through foods or supplements, at least 600 - 800 IU vit D daily.  Weight bearing exercise discussed.  Check PTH, BMP, vit D, phosphorus, CBC, TSH.   Treatment options for osteoporosis reviewed.  Reclast and Prolia were the focus of  our treatment choices.  Risks and benefits reviewed.  Reclast may be the best choice at this point.  Final plan to follow.  BMD anticipated in 2 years.   Patient advised through our office staff how to contact [facility] to request correction in her medical record.    40 min total time was spent for this patient encounter, including preparation, face-to-face counseling with the patient, coordination of care, and documentation of the encounter.

## 2022-12-23 IMAGING — MG MM DIGITAL SCREENING BILAT W/ TOMO AND CAD
6 of 10 series · 6 of 30 positions shown · non-contrast
Comparison: Previous exam(s).

CLINICAL DATA: Screening.

EXAM:
DIGITAL SCREENING BILATERAL MAMMOGRAM WITH TOMOSYNTHESIS AND CAD
TECHNIQUE: Bilateral screening digital craniocaudal and mediolateral oblique
mammograms were obtained. Bilateral screening digital breast
tomosynthesis was performed. The images were evaluated with
computer-aided detection.

[R MLO synth-2D (1 of 2)]
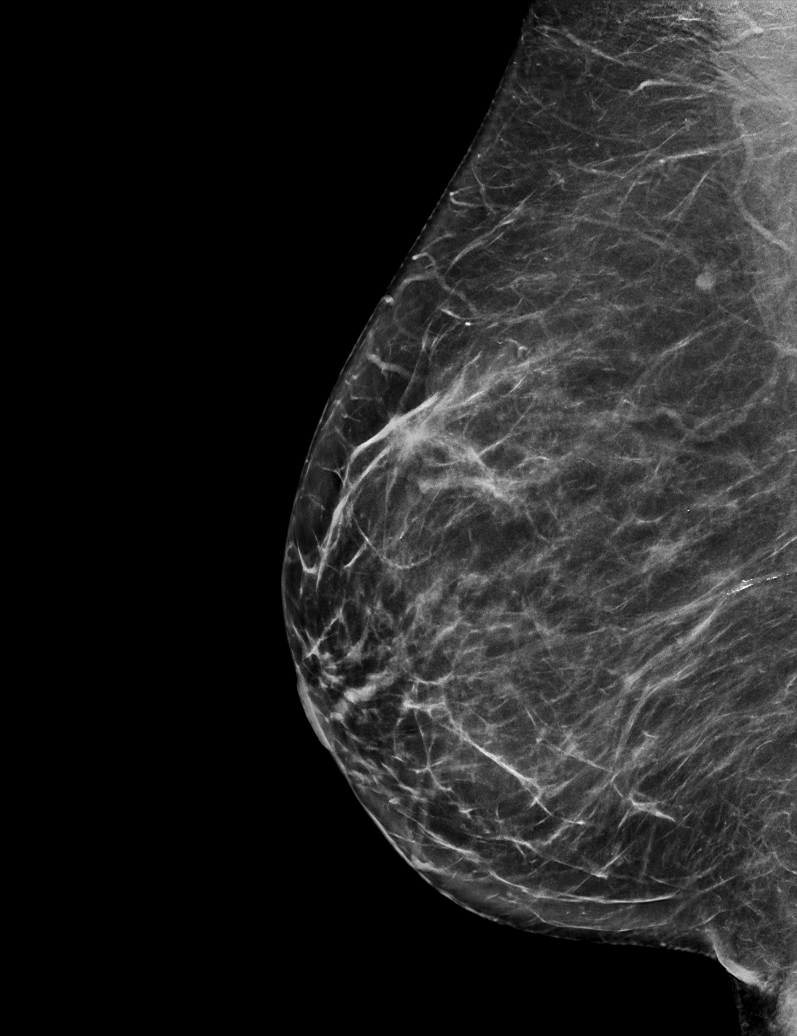

[L CC synth-2D]
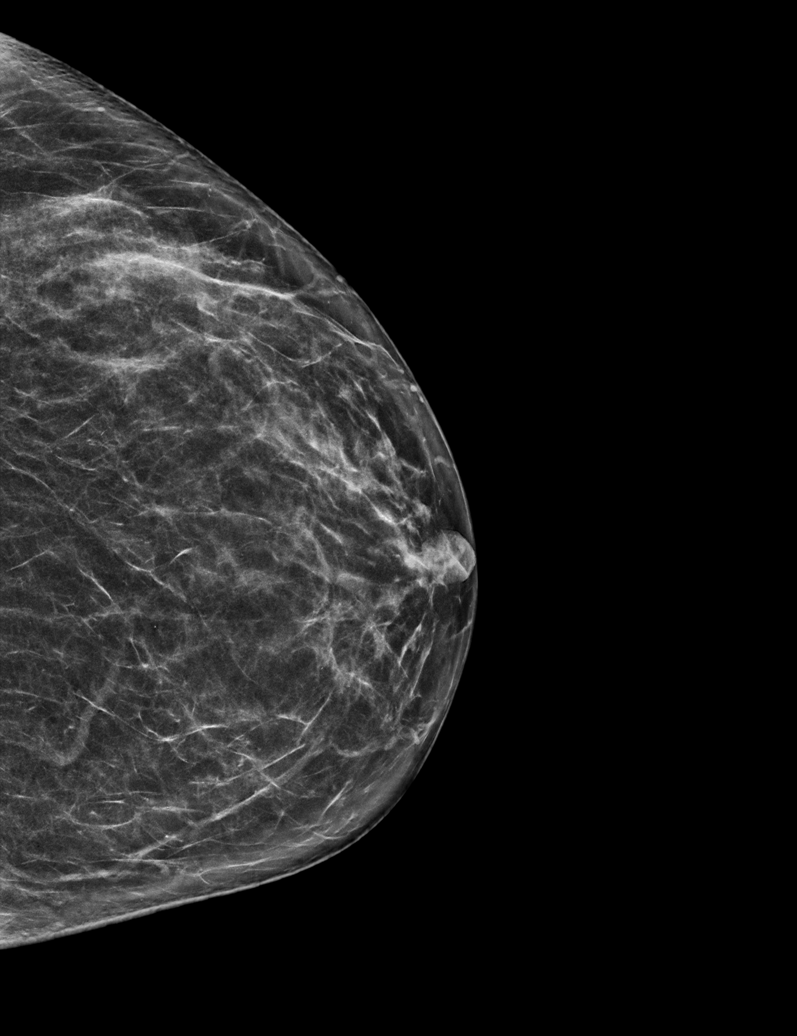

[R MLO synth-2D (2 of 2)]
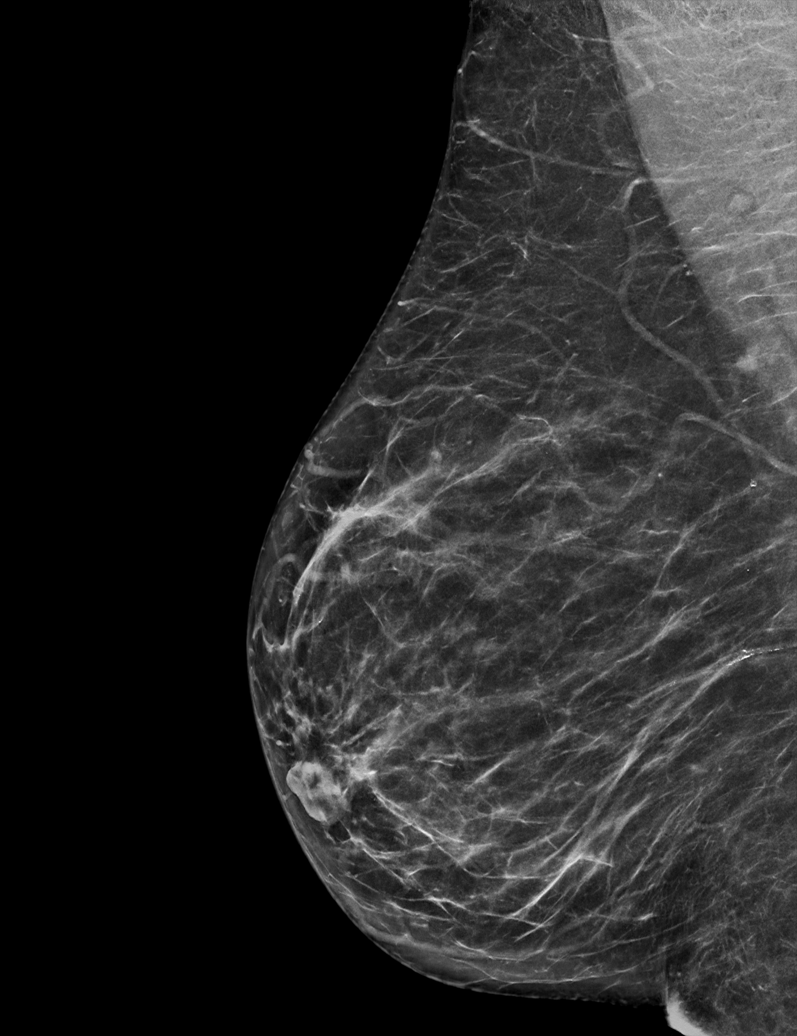

[R CC synth-2D]
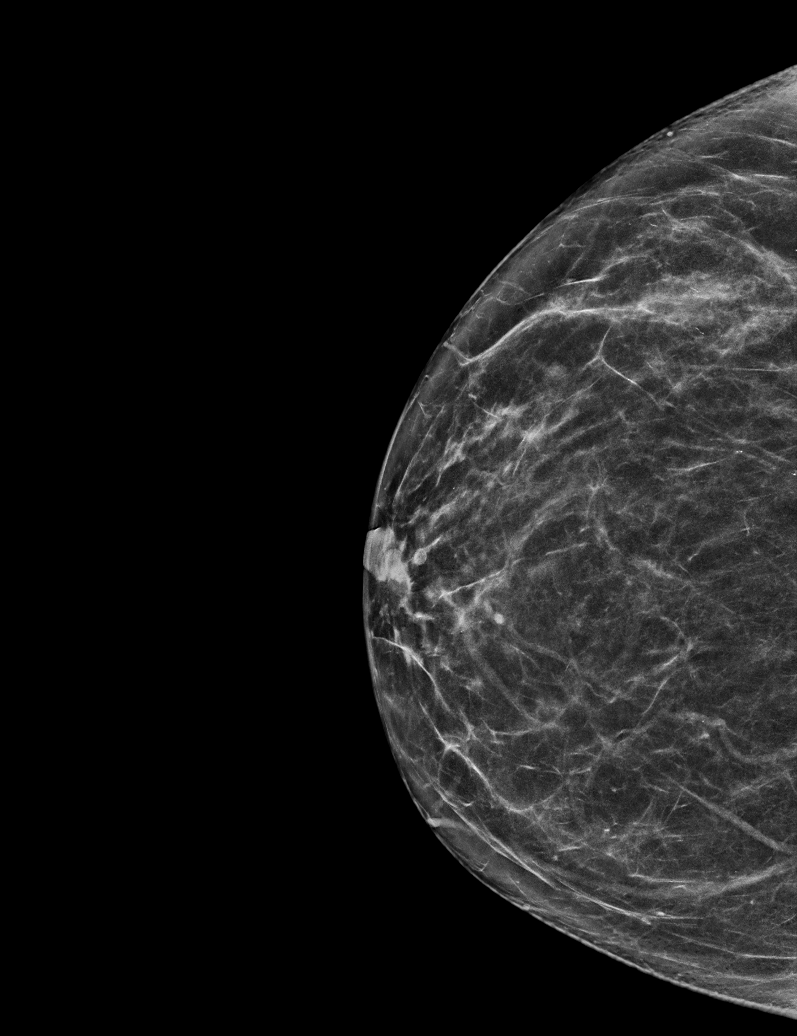

[L MLO synth-2D]
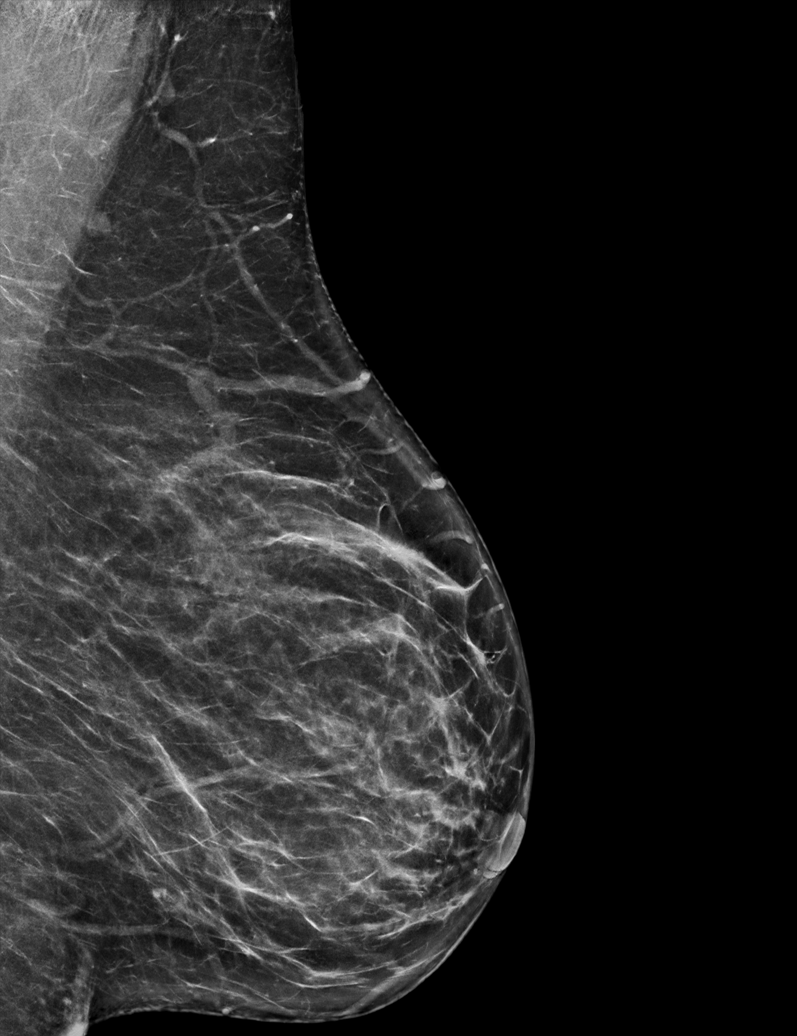

[R MLO tomo · tomo slice 37/72.0]
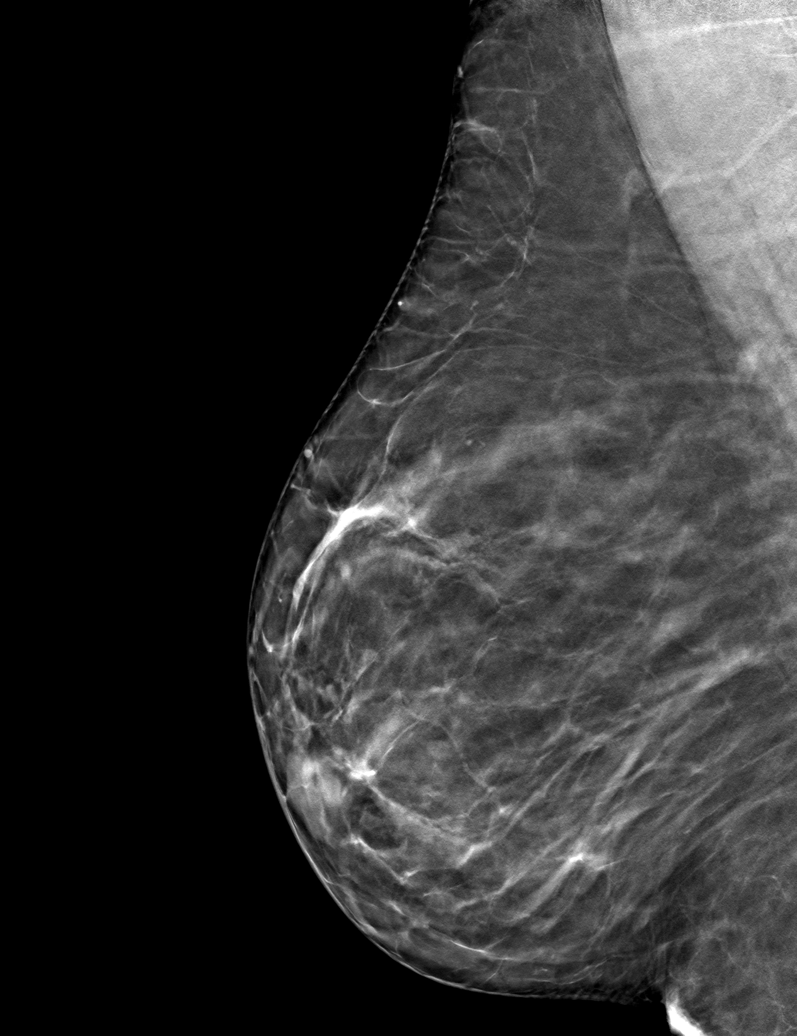

[6 of 30 positions shown; findings below may reference images not displayed]

ACR Breast Density Category b: There are scattered areas of
fibroglandular density.
FINDINGS: There are no findings suspicious for malignancy. The images were
evaluated with computer-aided detection.
IMPRESSION: No mammographic evidence of malignancy. A result letter of this
screening mammogram will be mailed directly to the patient.

RECOMMENDATION:
Screening mammogram in one year. (Code:WJ-I-BG6)

BI-RADS CATEGORY  1: Negative.

## 2022-12-25 ENCOUNTER — Ambulatory Visit: Payer: PPO | Admitting: Obstetrics and Gynecology

## 2022-12-25 ENCOUNTER — Encounter: Payer: Self-pay | Admitting: Obstetrics and Gynecology

## 2022-12-25 VITALS — BP 126/80 | Ht 69.0 in

## 2022-12-25 DIAGNOSIS — E785 Hyperlipidemia, unspecified: Secondary | ICD-10-CM | POA: Diagnosis not present

## 2022-12-25 DIAGNOSIS — Z90710 Acquired absence of both cervix and uterus: Secondary | ICD-10-CM | POA: Diagnosis not present

## 2022-12-25 DIAGNOSIS — M810 Age-related osteoporosis without current pathological fracture: Secondary | ICD-10-CM | POA: Diagnosis not present

## 2022-12-25 DIAGNOSIS — R7303 Prediabetes: Secondary | ICD-10-CM | POA: Diagnosis not present

## 2022-12-25 DIAGNOSIS — E559 Vitamin D deficiency, unspecified: Secondary | ICD-10-CM | POA: Diagnosis not present

## 2022-12-25 NOTE — Patient Instructions (Addendum)
Calcium Content in Foods Calcium is the most abundant mineral in the body. Most of the body's calcium supply is stored in bones and teeth. Calcium helps many parts of the body function normally, including: Blood and blood vessels. Nerves. Hormones. Muscles. Bones and teeth. When your calcium stores are low, you may be at risk for low bone mass, bone loss, and broken bones (fractures). When you get enough calcium, it helps to support strong bones and teeth throughout your life. Calcium is especially important for: Children during growth spurts. Girls during adolescence. Women who are pregnant or breastfeeding. Women after their menstrual cycle stops (postmenopause). Women whose menstrual cycle has stopped due to anorexia nervosa or regular intense exercise. People who cannot eat or digest dairy products. Vegans. Recommended daily amounts of calcium: Women (ages 73 to 44): 1,000 mg per day. Women (ages 44 and older): 1,200 mg per day. Men (ages 35 to 74): 1,000 mg per day. Men (ages 87 and older): 1,200 mg per day. Women (ages 29 to 79): 1,300 mg per day. Men (ages 4 to 11): 1,300 mg per day. General information Eat foods that are high in calcium. Try to get most of your calcium from food. Some people may benefit from taking calcium supplements. Check with your health care provider or diet and nutrition specialist (dietitian) before starting any calcium supplements. Calcium supplements may interact with certain medicines. Too much calcium may cause other health problems, such as constipation and kidney stones. For the body to absorb calcium, it needs vitamin D. Sources of vitamin D include: Skin exposure to direct sunlight. Foods, such as egg yolks, liver, mushrooms, saltwater fish, and fortified milk. Vitamin D supplements. Check with your health care provider or dietitian before starting any vitamin D supplements. What foods are high in calcium?  Foods that are high in calcium contain  more than 100 milligrams per serving. Fruits Fortified orange juice or other fruit juice, 300 mg per 8 oz serving. Vegetables Collard greens, 360 mg per 8 oz serving. Kale, 100 mg per 8 oz serving. Bok choy, 160 mg per 8 oz serving. Grains Fortified ready-to-eat cereals, 100 to 1,000 mg per 8 oz serving. Fortified frozen waffles, 200 mg in 2 waffles. Oatmeal, 140 mg in 1 cup. Meats and other proteins Sardines, canned with bones, 325 mg per 3 oz serving. Salmon, canned with bones, 180 mg per 3 oz serving. Canned shrimp, 125 mg per 3 oz serving. Baked beans, 160 mg per 4 oz serving. Tofu, firm, made with calcium sulfate, 253 mg per 4 oz serving. Dairy Yogurt, plain, low-fat, 310 mg per 6 oz serving. Nonfat milk, 300 mg per 8 oz serving. American cheese, 195 mg per 1 oz serving. Cheddar cheese, 205 mg per 1 oz serving. Cottage cheese 2%, 105 mg per 4 oz serving. Fortified soy, rice, or almond milk, 300 mg per 8 oz serving. Mozzarella, part skim, 210 mg per 1 oz serving. The items listed above may not be a complete list of foods high in calcium. Actual amounts of calcium may be different depending on processing. Contact a dietitian for more information. What foods are lower in calcium? Foods that are lower in calcium contain 50 mg or less per serving. Fruits Apple, about 6 mg. Banana, about 12 mg. Vegetables Lettuce, 19 mg per 2 oz serving. Tomato, about 11 mg. Grains Rice, 4 mg per 6 oz serving. Boiled potatoes, 14 mg per 8 oz serving. White bread, 6 mg per slice. Meats and other proteins  Egg, 27 mg per 2 oz serving. Red meat, 7 mg per 4 oz serving. Chicken, 17 mg per 4 oz serving. Fish, cod, or trout, 20 mg per 4 oz serving. Dairy Cream cheese, regular, 14 mg per 1 Tbsp serving. Brie cheese, 50 mg per 1 oz serving. Parmesan cheese, 70 mg per 1 Tbsp serving. The items listed above may not be a complete list of foods lower in calcium. Actual amounts of calcium may be  different depending on processing. Contact a dietitian for more information. Summary Calcium is an important mineral in the body because it affects many functions. Getting enough calcium helps support strong bones and teeth throughout your life. Try to get most of your calcium from food. Calcium supplements may interact with certain medicines. Check with your health care provider or dietitian before starting any calcium supplements. This information is not intended to replace advice given to you by your health care provider. Make sure you discuss any questions you have with your health care provider. Document Revised: 12/21/2019 Document Reviewed: 12/21/2019 Elsevier Patient Education  2023 [employer]   Zoledronic Acid Injection (Bone Disorders) What is this medication? ZOLEDRONIC ACID (ZOE le dron ik AS id) prevents and treats osteoporosis. It may also be used to treat Paget's disease of the bone. It works by Interior and spatial designer stronger and less likely to break (fracture). It belongs to a group of medications called bisphosphonates. This medicine may be used for other purposes; ask your health care provider or pharmacist if you have questions. COMMON BRAND NAME(S): Reclast What should I tell my care team before I take this medication? They need to know if you have any of these conditions: Bleeding disorder Cancer Dental disease Kidney disease Low levels of calcium in the blood Low red blood cell counts Lung or breathing disease, such as asthma Receiving steroids, such as dexamethasone or prednisone An unusual or allergic reaction to zoledronic acid, other medications, foods, dyes, or preservatives Pregnant or trying to get pregnant Breast-feeding How should I use this medication? This medication is injected into a vein. It is given by your care team in a hospital or clinic setting. A special MedGuide will be given to you before each treatment. Be sure to read this information  carefully each time. Talk to your care team about the use of this medication in children. Special care may be needed. Overdosage: If you think you have taken too much of this medicine contact a poison control center or emergency room at once. NOTE: This medicine is only for you. Do not share this medicine with others. What if I miss a dose? Keep appointments for follow-up doses. It is important not to miss your dose. Call your care team if you are unable to keep an appointment. What may interact with this medication? Certain antibiotics given by injection Medications for pain and inflammation, such as ibuprofen, naproxen, NSAIDs Some diuretics, such as bumetanide, furosemide Teriparatide This list may not describe all possible interactions. Give your health care provider a list of all the medicines, herbs, non-prescription drugs, or dietary supplements you use. Also tell them if you smoke, drink alcohol, or use illegal drugs. Some items may interact with your medicine. What should I watch for while using this medication? Visit your care team for regular checks on your progress. It may be some time before you see the benefit from this medication. Some people who take this medication have severe bone, joint, or muscle pain. This medication may also increase  your risk for jaw problems or a broken thigh bone. Tell your care team right away if you have severe pain in your jaw, bones, joints, or muscles. Tell your care team if you have any pain that does not go away or that gets worse. You should make sure you get enough calcium and vitamin D while you are taking this medication. Discuss the foods you eat and the vitamins you take with your care team. You may need bloodwork while taking this medication. Tell your dentist and dental surgeon that you are taking this medication. You should not have major dental surgery while on this medication. See your dentist to have a dental exam and fix any dental  problems before starting this medication. Take good care of your teeth while on this medication. Make sure you see your dentist for regular follow-up appointments. What side effects may I notice from receiving this medication? Side effects that you should report to your care team as soon as possible: Allergic reactions--skin rash, itching, hives, swelling of the face, lips, tongue, or throat Kidney injury--decrease in the amount of urine, swelling of the ankles, hands, or feet Low calcium level--muscle pain or cramps, confusion, tingling, or numbness in the hands or feet Osteonecrosis of the jaw--pain, swelling, or redness in the mouth, numbness of the jaw, poor healing after dental work, unusual discharge from the mouth, visible bones in the mouth Severe bone, joint, or muscle pain Side effects that usually do not require medical attention (report to your care team if they continue or are bothersome): Diarrhea Dizziness Headache Nausea Stomach pain Vomiting This list may not describe all possible side effects. Call your doctor for medical advice about side effects. You may report side effects to FDA at 1-800-FDA-1088. Where should I keep my medication? This medication is given in a hospital or clinic. It will not be stored at home. NOTE: This sheet is a summary. It may not cover all possible information. If you have questions about this medicine, talk to your doctor, pharmacist, or health care provider.  2023 Elsevier/Gold Standard (2021-10-11 00:00:00)   Denosumab Injection (Osteoporosis) What is this medication? DENOSUMAB (den oh SUE mab) prevents and treats osteoporosis. It works by Interior and spatial designer stronger and less likely to break (fracture). It is a monoclonal antibody. This medicine may be used for other purposes; ask your health care provider or pharmacist if you have questions. COMMON BRAND NAME(S): Prolia What should I tell my care team before I take this medication? They  need to know if you have any of these conditions: Dental or gum disease, or plan to have dental surgery or a tooth pulled Infection Kidney disease Low levels of calcium or vitamin D in your blood On dialysis Poor nutrition Skin conditions Thyroid disease, or have had thyroid or parathyroid surgery Trouble absorbing minerals in your stomach or intestine An unusual or allergic reaction to denosumab, other medications, foods, dyes, or preservatives Pregnant or trying to get pregnant Breastfeeding How should I use this medication? This medication is injected under the skin. It is given by your care team in a hospital or clinic setting. A special MedGuide will be given to you before each treatment. Be sure to read this information carefully each time. Talk to your care team about the use of this medication in children. Special care may be needed. Overdosage: If you think you have taken too much of this medicine contact a poison control center or emergency room at once. NOTE: This medicine  is only for you. Do not share this medicine with others. What if I miss a dose? Keep appointments for follow-up doses. It is important not to miss your dose. Call your care team if you are unable to keep an appointment. What may interact with this medication? Do not take this medication with any of the following: Other medications that contain denosumab This medication may also interact with the following: Medications that lower your chance of fighting infection Steroid medications, such as prednisone or cortisone This list may not describe all possible interactions. Give your health care provider a list of all the medicines, herbs, non-prescription drugs, or dietary supplements you use. Also tell them if you smoke, drink alcohol, or use illegal drugs. Some items may interact with your medicine. What should I watch for while using this medication? Your condition will be monitored carefully while you are  receiving this medication. You may need blood work while taking this medication. This medication may increase your risk of getting an infection. Call your care team for advice if you get a fever, chills, sore throat, or other symptoms of a cold or flu. Do not treat yourself. Try to avoid being around people who are sick. Tell your dentist and dental surgeon that you are taking this medication. You should not have major dental surgery while on this medication. See your dentist to have a dental exam and fix any dental problems before starting this medication. Take good care of your teeth while on this medication. Make sure you see your dentist for regular follow-up appointments. You should make sure you get enough calcium and vitamin D while you are taking this medication. Discuss the foods you eat and the vitamins you take with your care team. Talk to your care team if you are pregnant or think you might be pregnant. This medication can cause serious birth defects if taken during pregnancy and for 5 months after the last dose. You will need a negative pregnancy test before starting this medication. Contraception is recommended while taking this medication and for 5 months after the last dose. Your care team can help you find the option that works for you. Talk to your care team before breastfeeding. Changes to your treatment plan may be needed. What side effects may I notice from receiving this medication? Side effects that you should report to your care team as soon as possible: Allergic reactions--skin rash, itching, hives, swelling of the face, lips, tongue, or throat Infection--fever, chills, cough, sore throat, wounds that don't heal, pain or trouble when passing urine, general feeling of discomfort or being unwell Low calcium level--muscle pain or cramps, confusion, tingling, or numbness in the hands or feet Osteonecrosis of the jaw--pain, swelling, or redness in the mouth, numbness of the jaw, poor  healing after dental work, unusual discharge from the mouth, visible bones in the mouth Severe bone, joint, or muscle pain Skin infection--skin redness, swelling, warmth, or pain Side effects that usually do not require medical attention (report these to your care team if they continue or are bothersome): Back pain Headache Joint pain Muscle pain Pain in the hands, arms, legs, or feet Runny or stuffy nose Sore throat This list may not describe all possible side effects. Call your doctor for medical advice about side effects. You may report side effects to FDA at 1-800-FDA-1088. Where should I keep my medication? This medication is given in a hospital or clinic. It will not be stored at home. NOTE: This sheet is a  summary. It may not cover all possible information. If you have questions about this medicine, talk to your doctor, pharmacist, or health care provider.  2023 Elsevier/Gold Standard (2022-01-06 00:00:00)   Osteoporosis  Osteoporosis happens when the bones become thin and less dense than normal. Osteoporosis makes bones more brittle and fragile and more likely to break (fracture). Over time, osteoporosis can cause your bones to become so weak that they fracture after a minor fall. Bones in the hip, wrist, and spine are most likely to fracture due to osteoporosis. What are the causes? The exact cause of this condition is not known. What increases the risk? You are more likely to develop this condition if you: Have family members with this condition. Have poor nutrition. Use the following: Steroid medicines, such as prednisone. Anti-seizure medicines. Nicotine or tobacco, such as cigarettes, e-cigarettes, and chewing tobacco. Are female. Are age 69 or older. Are not physically active (are sedentary). Are of European or Asian descent. Have a small body frame. What are the signs or symptoms? A fracture might be the first sign of osteoporosis, especially if the fracture  results from a fall or injury that usually would not cause a bone to break. Other signs and symptoms include: Pain in the neck or low back. Stooped posture. Loss of height. How is this diagnosed? This condition may be diagnosed based on: Your medical history. A physical exam. A bone mineral density test, also called a DXA or DEXA test (dual-energy X-ray absorptiometry test). This test uses X-rays to measure the amount of minerals in your bones. How is this treated? This condition may be treated by: Making lifestyle changes, such as: Including foods with more calcium and vitamin D in your diet. Doing weight-bearing and muscle-strengthening exercises. Stopping tobacco use. Limiting alcohol intake. Taking medicine to slow the process of bone loss or to increase bone density. Taking daily supplements of calcium and vitamin D. Taking hormone replacement medicines, such as estrogen for women and testosterone for men. Monitoring your levels of calcium and vitamin D. The goal of treatment is to strengthen your bones and lower your risk for a fracture. Follow these instructions at home: Eating and drinking Include calcium and vitamin D in your diet. Calcium is important for bone health, and vitamin D helps your body absorb calcium. Good sources of calcium and vitamin D include: Certain fatty fish, such as salmon and tuna. Products that have calcium and vitamin D added to them (are fortified), such as fortified cereals. Egg yolks. Cheese. Liver.  Activity Do exercises as told by your health care provider. Ask your health care provider what exercises and activities are safe for you. You should do: Exercises that make you work against gravity (weight-bearing exercises), such as tai chi, yoga, or walking. Exercises to strengthen muscles, such as lifting weights. Lifestyle Do not drink alcohol if: Your health care provider tells you not to drink. You are pregnant, may be pregnant, or are  planning to become pregnant. If you drink alcohol: Limit how much you use to: 0-1 drink a day for women. 0-2 drinks a day for men. Know how much alcohol is in your drink. In the [place] one drink equals one 12 oz bottle of beer (355 mL), one 5 oz glass of wine (148 mL), or one 1 oz glass of hard liquor (44 mL). Do not use any products that contain nicotine or tobacco, such as cigarettes, e-cigarettes, and chewing tobacco. If you need help quitting, ask your health care provider.  Preventing falls Use devices to help you move around (mobility aids) as needed, such as canes, walkers, scooters, or crutches. Keep rooms well-lit and clutter-free. Remove tripping hazards from walkways, including cords and throw rugs. Install grab bars in bathrooms and safety rails on stairs. Use rubber mats in the bathroom and other areas that are often wet or slippery. Wear closed-toe shoes that fit well and support your feet. Wear shoes that have rubber soles or low heels. Review your medicines with your health care provider. Some medicines can cause dizziness or changes in blood pressure, which can increase your risk of falling. General instructions Take over-the-counter and prescription medicines only as told by your health care provider. Keep all follow-up visits. This is important. Contact a health care provider if: You have never been screened for osteoporosis and you are: A woman who is age 30 or older. A man who is age 60 or older. Get help right away if: You fall or injure yourself. Summary Osteoporosis is thinning and loss of density in your bones. This makes bones more brittle and fragile and more likely to break (fracture),even with minor falls. The goal of treatment is to strengthen your bones and lower your risk for a fracture. Include calcium and vitamin D in your diet. Calcium is important for bone health, and vitamin D helps your body absorb calcium. Talk with your health care provider about  screening for osteoporosis if you are a woman who is age 18 or older, or a man who is age 3 or older. This information is not intended to replace advice given to you by your health care provider. Make sure you discuss any questions you have with your health care provider. Document Revised: 02/09/2020 Document Reviewed: 02/09/2020 Elsevier Patient Education  2023 [employer].

## 2022-12-26 LAB — CBC
HCT: 42.5 % (ref 35.0–45.0)
Hemoglobin: 14.2 g/dL (ref 11.7–15.5)
MCH: 29.4 pg (ref 27.0–33.0)
MCHC: 33.4 g/dL (ref 32.0–36.0)
MCV: 88 fL (ref 80.0–100.0)
MPV: 9.9 fL (ref 7.5–12.5)
Platelets: 329 10*3/uL (ref 140–400)
RBC: 4.83 10*6/uL (ref 3.80–5.10)
RDW: 12.7 % (ref 11.0–15.0)
WBC: 5.6 10*3/uL (ref 3.8–10.8)

## 2022-12-26 LAB — PARATHYROID HORMONE, INTACT (NO CA): PTH: 48 pg/mL (ref 16–77)

## 2022-12-26 LAB — BASIC METABOLIC PANEL
BUN: 11 mg/dL (ref 7–25)
CO2: 29 mmol/L (ref 20–32)
Calcium: 9.5 mg/dL (ref 8.6–10.4)
Chloride: 104 mmol/L (ref 98–110)
Creat: 0.57 mg/dL (ref 0.50–1.05)
Glucose, Bld: 103 mg/dL — ABNORMAL HIGH (ref 65–99)
Potassium: 4.8 mmol/L (ref 3.5–5.3)
Sodium: 140 mmol/L (ref 135–146)

## 2022-12-26 LAB — VITAMIN D 25 HYDROXY (VIT D DEFICIENCY, FRACTURES): Vit D, 25-Hydroxy: 22 ng/mL — ABNORMAL LOW (ref 30–100)

## 2022-12-26 LAB — TSH: TSH: 1.11 mIU/L (ref 0.40–4.50)

## 2022-12-26 LAB — PHOSPHORUS: Phosphorus: 4.3 mg/dL (ref 2.1–4.3)

## 2022-12-28 ENCOUNTER — Other Ambulatory Visit: Payer: Self-pay | Admitting: Obstetrics and Gynecology

## 2022-12-28 DIAGNOSIS — R7989 Other specified abnormal findings of blood chemistry: Secondary | ICD-10-CM

## 2022-12-29 ENCOUNTER — Other Ambulatory Visit: Payer: Self-pay

## 2022-12-29 MED ORDER — VITAMIN D (ERGOCALCIFEROL) 1.25 MG (50000 UNIT) PO CAPS: 50000.0000 [IU] | ORAL_CAPSULE | ORAL | 0 refills | Status: DC

## 2023-01-29 NOTE — Progress Notes (Signed)
GYNECOLOGY  VISIT   HPI: 67 y.o.   Married  Caucasian  female   [phone] with Patient's last menstrual period was 09/08/2006.   here for   6 week f/u.  Has osteoporosis and low vit D.  Was started on high dose vit D 50,000 IU in April, 2024.   Having frequent urination and burning with urination.  Voiding small amounts.  Some low abdominal pressure. No blood in urine.  No back pain or nausea. Not having a lot of pain.   Will be facing future dental surgery and may need an upper denture.   She states her skin health is good and that she just has easy bruising.   GYNECOLOGIC HISTORY: Patient's last menstrual period was 09/08/2006. 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   Prolapse of uterus 12/29/2012   Cystocele 12/29/2012   Seizure (HCC)     Past Medical History:  Diagnosis Date   Seizure (HCC) 2007   frontal lobe, only one seizure in 2007, none since   SVD (spontaneous vaginal delivery)    x 6    Past Surgical History:  Procedure Laterality Date   ABDOMINAL HYSTERECTOMY N/A 03/04/2016   Procedure: HYSTERECTOMY ABDOMINAL;  Surgeon: Patton Salles, MD;  Location: [facility];  Service: Gynecology;  Laterality: N/A;  ANESTHESIA CONSULT NEEDED   ABDOMINAL SACROCOLPOPEXY N/A 03/04/2016   Procedure: ABDOMINO SACROCOLPOPEXY with HALBAN'S CULDOPLASTY;  Surgeon: Patton Salles, MD;  Location: [facility];  Service: Gynecology;  Laterality: N/A;   ANTERIOR AND  POSTERIOR REPAIR N/A 03/04/2016   Procedure: ANTERIOR (CYSTOCELE) AND POSTERIOR REPAIR (RECTOCELE);  Surgeon: Patton Salles, MD;  Location: [facility];  Service: Gynecology;  Laterality: N/A;   BILATERAL SALPINGECTOMY  03/04/2016   Procedure: BILATERAL SALPINGECTOMY;  Surgeon: Patton Salles, MD;  Location: [facility];  Service: Gynecology;;   BLADDER SUSPENSION N/A 03/04/2016   Procedure: TRANSVAGINAL TAPE (TVT) PROCEDURE exact midurethral sling;  Surgeon: Patton Salles, MD;  Location: [facility];  Service: Gynecology;  Laterality: N/A;   COLONOSCOPY     normal   CYSTO N/A 03/04/2016   Procedure: CYSTO;  Surgeon: Patton Salles, MD;  Location: [facility];  Service: Gynecology;  Laterality: N/A;   DIAGNOSTIC LAPAROSCOPY Left 1986   LSO for dermoid cyst   OOPHORECTOMY Left 1986   LSO-Dermoid   OOPHORECTOMY  03/04/2016   Procedure: RIGHT OOPHORECTOMY;  Surgeon: Patton Salles, MD;  Location: [facility];  Service: Gynecology;;   TONSILLECTOMY     as a child    Current Outpatient Medications  Medication Sig  Dispense Refill   aspirin 81 MG chewable tablet Chew 81 mg by mouth daily.     carbamazepine (TEGRETOL) 200 MG tablet Take 1 tablet (200 mg total) by mouth 3 (three) times daily. 270 tablet 4   COLLAGEN PO Take by mouth daily. Powder mixes with milk  take daily 10.5 gram in scoop     polyethylene glycol (MIRALAX / GLYCOLAX) packet Take 17 g by mouth daily. 1/2 cap po daily     Vitamin D, Ergocalciferol, (DRISDOL) 1.25 MG (50000 UNIT) CAPS capsule Take 1 capsule (50,000 Units total) by mouth every 7 (seven) days. 6 capsule 0   No current facility-administered medications for this visit.     ALLERGIES: Patient has no known allergies.  Family History  Problem Relation Age of Onset   Osteoarthritis Mother    Glaucoma Father 66   Seizures Father        developed very late in life    Spina bifida Brother    Cancer Maternal Grandfather    Diabetes Paternal  Grandmother    Breast cancer Neg Hx     Social History   Socioeconomic History   Marital status: Married    Spouse name: Not on file   Number of children: 6   Years of education: 12th   Highest education level: Not on file  Occupational History   Occupation: homemaker  Tobacco Use   Smoking status: Never   Smokeless tobacco: Never  Vaping Use   Vaping Use: Never used  Substance and Sexual Activity   Alcohol use: No    Alcohol/week: 0.0 standard drinks of alcohol   Drug use: No   Sexual activity: Yes    Partners: Male    Birth control/protection: Post-menopausal, Surgical    Comment: TAH/Bil.salpingectomy/RSO-1st intercourse 67 yo-Fewer than 5 partners  Other Topics Concern   Not on file  Social History Narrative   Patient lives at home with her husband and has 6 children. 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    Right handed   Caffeine: 32 oz coffee and about 24 oz hot tea daily   Social Determinants of Health   Financial Resource Strain: Not on file  Food Insecurity: Not on file  Transportation Needs: Not on file  Physical Activity: Not on file  Stress: Not on file  Social Connections: Not on file  Intimate Partner Violence: Not on file    Review of Systems  All other systems reviewed and are negative.   PHYSICAL EXAMINATION:    BP 126/84 (BP Location: Left Arm, Patient Position: Sitting, Cuff Size: Normal)   Pulse 65   Ht 5\' 9"  (1.753 m)   Wt 167 lb (75.8 kg)   LMP 09/08/2006   SpO2 98%   BMI 24.66 kg/m     General appearance: alert, cooperative and appears stated age  ASSESSMENT  Dysuria.  Osteoporosis.  Low vit D.    PLAN  Urinalysis:  sg 1.010, pH7.0, 0 - 5 WBC, NS RBC, 0 - 5 epis, NS bacteria.  UC pending.  Will wait for final UC result to do any potential abx.  Check vit D level today.  We reviewed daily recommendation for calcium 1200 mg intake. We reviewed Reclast and Prolia.   Patient will check with her dentist  regarding her dental health and recommendations for one Rx versus the other for her dental health.  She will contact me back with her preference for osteoporosis medication, so we can do a  precert.  FU prn.   25 min  total time was spent for this patient encounter, including preparation, face-to-face counseling with the patient, coordination of care, and documentation of the encounter.

## 2023-02-12 ENCOUNTER — Encounter: Payer: Self-pay | Admitting: Obstetrics and Gynecology

## 2023-02-12 ENCOUNTER — Ambulatory Visit (INDEPENDENT_AMBULATORY_CARE_PROVIDER_SITE_OTHER): Payer: PPO | Admitting: Obstetrics and Gynecology

## 2023-02-12 VITALS — BP 126/84 | HR 65 | Ht 69.0 in | Wt 167.0 lb

## 2023-02-12 DIAGNOSIS — R300 Dysuria: Secondary | ICD-10-CM | POA: Diagnosis not present

## 2023-02-12 DIAGNOSIS — Z90710 Acquired absence of both cervix and uterus: Secondary | ICD-10-CM | POA: Diagnosis not present

## 2023-02-12 DIAGNOSIS — E559 Vitamin D deficiency, unspecified: Secondary | ICD-10-CM | POA: Diagnosis not present

## 2023-02-12 DIAGNOSIS — M810 Age-related osteoporosis without current pathological fracture: Secondary | ICD-10-CM

## 2023-02-12 DIAGNOSIS — E785 Hyperlipidemia, unspecified: Secondary | ICD-10-CM | POA: Diagnosis not present

## 2023-02-12 DIAGNOSIS — R7989 Other specified abnormal findings of blood chemistry: Secondary | ICD-10-CM | POA: Diagnosis not present

## 2023-02-12 DIAGNOSIS — R7303 Prediabetes: Secondary | ICD-10-CM | POA: Diagnosis not present

## 2023-02-13 LAB — VITAMIN D 25 HYDROXY (VIT D DEFICIENCY, FRACTURES): Vit D, 25-Hydroxy: 53 ng/mL (ref 30–100)

## 2023-02-14 LAB — URINE CULTURE
MICRO NUMBER:: 15049390
SPECIMEN QUALITY:: ADEQUATE

## 2023-02-14 LAB — URINALYSIS, COMPLETE W/RFL CULTURE
Bacteria, UA: NONE SEEN /HPF
Bilirubin Urine: NEGATIVE
Glucose, UA: NEGATIVE
Hgb urine dipstick: NEGATIVE
Hyaline Cast: NONE SEEN /LPF
Ketones, ur: NEGATIVE
Nitrites, Initial: NEGATIVE
Protein, ur: NEGATIVE
RBC / HPF: NONE SEEN /HPF (ref 0–2)
Specific Gravity, Urine: 1.01 (ref 1.001–1.035)
pH: 7 (ref 5.0–8.0)

## 2023-02-14 LAB — CULTURE INDICATED

## 2023-02-15 ENCOUNTER — Telehealth: Payer: Self-pay | Admitting: Obstetrics and Gynecology

## 2023-02-15 MED ORDER — SULFAMETHOXAZOLE-TRIMETHOPRIM 800-160 MG PO TABS: 1.0000 | ORAL_TABLET | Freq: Two times a day (BID) | ORAL | 0 refills | Status: DC

## 2023-02-15 NOTE — Telephone Encounter (Signed)
LM on patient's voicemail per DPI that she has a bladder infection and that I am sending a prescription to her [facility] for Bactrim DS, one by mouth, twice daily for 3 days.   Please confirm that she received this message.  She does not use My Chart.

## 2023-02-16 NOTE — Telephone Encounter (Signed)
Encounter reviewed and closed.  

## 2023-02-16 NOTE — Telephone Encounter (Signed)
Patient did get the message regarding the bactrim & I also gave her vitamin d results. Patient wanted to let you know how much she appreciates you & what a wonderful doctor you are.

## 2023-06-03 ENCOUNTER — Telehealth: Payer: PPO | Admitting: Neurology

## 2023-07-14 NOTE — Progress Notes (Deleted)
   Virtual Visit via Video Note  I connected with Calle Schader on 07/14/23 at  2:30 PM EST by a video enabled telemedicine application and verified that I am speaking with the correct person using two identifiers.  Location: Patient: at her home Provider: in the office    I discussed the limitations of evaluation and management by telemedicine and the availability of in person appointments. The patient expressed understanding and agreed to proceed.  History of Present Illness: 07/15/23 SS:   05/28/22 SS: Astryd is here today for follow-up. Doing well, remains on carbamazepine 200 mg 3 times daily. No issues. Only 1 seizure in 2007. Has been told in the past seizure activity was seen on EEG. No health issues. Drives a car. Labs last year showed carbamazepine 4.4, alk phos 143, glucose 102. She takes of her grand kids during the day. Has not been to PCP in a long time.   05/20/21 MM: Ms. Southern is a 67 year female with a history of seizures.  She returns today for follow-up.  She had a single nocturnal generalized seizure in 2007.  She is currently on carbamazepine 200 mg 3 times a day.  She denies any recent seizure events.  She lives at home and is able to complete all ADLs independently.  She operates a Librarian, academic without difficulty.  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  She returns today for an evaluation.   Observations/Objective: Alert and oriented, speech is clear and concise, facial symmetry noted, moves all extremities freely  Assessment and Plan: Seizures   -Doing well, no recent seizures -Continue carbamazepine 200 mg 3 times daily -Reviewed blood work from last year, will update labs, she will come at her convenience -Follow up in 1 year or sooner if needed   Follow Up Instructions: 1 year virtual visit    I discussed the assessment and treatment plan with the patient. The patient was provided an opportunity to ask questions and all were answered. The  patient agreed with the plan and demonstrated an understanding of the instructions.   The patient was advised to call back or seek an in-person evaluation if the symptoms worsen or if the condition fails to improve as anticipated.  Otila Kluver, DNP  [facility] [address], [address] [place], [place] [address] [phone]

## 2023-07-15 ENCOUNTER — Telehealth: Payer: PPO | Admitting: Neurology

## 2023-08-13 DIAGNOSIS — H26493 Other secondary cataract, bilateral: Secondary | ICD-10-CM | POA: Diagnosis not present

## 2023-08-23 DIAGNOSIS — J020 Streptococcal pharyngitis: Secondary | ICD-10-CM | POA: Diagnosis not present

## 2023-08-23 DIAGNOSIS — R051 Acute cough: Secondary | ICD-10-CM | POA: Diagnosis not present

## 2023-10-14 NOTE — Progress Notes (Signed)
   Virtual Visit via Video Note  I connected with Tina Peck on 10/15/23 at  3:00 PM EST by a video enabled telemedicine application and verified that I am speaking with the correct person using two identifiers.  Location: Patient: at her home Provider: in the office    I discussed the limitations of evaluation and management by telemedicine and the availability of in person appointments. The patient expressed understanding and agreed to proceed.  History of Present Illness: Update 10/15/23 SS: Did not have labs sent at last visit, vitamin D  53, was taking supplement. Remains on carbamazepine  200 mg 3 times a day. No seizures know. Reports bone scan, was told osteoporosis in her spine.   05/28/22 SS: Tina Peck is here today for follow-up. Doing well, remains on carbamazepine  200 mg 3 times daily. No issues. Only 1 seizure in 2007. Has been told in the past seizure activity was seen on EEG. No health issues. Drives a car. Labs last year showed carbamazepine  4.4, alk phos 143, glucose 102. She takes of her grand kids during the day. Has not been to PCP in a long time.   05/20/21 MM: Tina Peck is a 68 year female with a history of seizures.  She returns today for follow-up.  She had a single nocturnal generalized seizure in 2007.  She is currently on carbamazepine  200 mg 3 times a day.  She denies any recent seizure events.  She lives at home and is able to complete all ADLs independently.  She operates a librarian, academic without difficulty.  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  She returns today for an evaluation.   Observations/Objective: Alert and oriented, speech is clear and concise, facial symmetry noted, moves all extremities freely  Assessment and Plan: Seizures   -Check EEG, discussed possibly switch to newer seizure medication -Single nocturnal generalized seizure in 2007, EEG in the past showing left frontotemporal epileptiform activity, thus has been maintained on  seizure medication -Recently diagnosed with osteoporosis to the spine -Consider switch to Keppra? Lamictal ? Both have extended release options  -For now continue carbamazepine  200 mg 3 times daily, order routine labs  Follow Up Instructions: After EEG, will review plan with Dr. Gregg. Was former patient of Dr. Maurice  I discussed the assessment and treatment plan with the patient. The patient was provided an opportunity to ask questions and all were answered. The patient agreed with the plan and demonstrated an understanding of the instructions.   The patient was advised to call back or seek an in-person evaluation if the symptoms worsen or if the condition fails to improve as anticipated.  Lauraine Gayland MANDES, DNP  [facility] [address] [place], [place] [address] [phone]

## 2023-10-15 ENCOUNTER — Telehealth: Payer: PPO | Admitting: Neurology

## 2023-10-15 DIAGNOSIS — G40309 Generalized idiopathic epilepsy and epileptic syndromes, not intractable, without status epilepticus: Secondary | ICD-10-CM | POA: Diagnosis not present

## 2023-10-15 MED ORDER — CARBAMAZEPINE 200 MG PO TABS: 200.0000 mg | ORAL_TABLET | Freq: Three times a day (TID) | ORAL | 4 refills | Status: AC

## 2023-10-15 NOTE — Patient Instructions (Signed)
 Order EEG, check labs when you come for EEG.  For now continue carbamazepine .

## 2023-11-06 ENCOUNTER — Ambulatory Visit: Payer: PPO | Admitting: Neurology

## 2023-11-06 DIAGNOSIS — G40309 Generalized idiopathic epilepsy and epileptic syndromes, not intractable, without status epilepticus: Secondary | ICD-10-CM

## 2023-11-06 NOTE — Procedures (Signed)
    History:  68 year old woman with seizure   EEG classification: Awake and drowsy  Duration: 27 minutes  Technical aspects: This EEG study was done with scalp electrodes positioned according to the 10-20 International system of electrode placement. Electrical activity was reviewed with band pass filter of 1-70Hz , sensitivity of 7 uV/mm, display speed of 18mm/sec with a 60Hz  notched filter applied as appropriate. EEG data were recorded continuously and digitally stored.   Description of the recording: The background rhythms of this recording consists of a fairly well modulated medium amplitude alpha rhythm of 11 Hz that is reactive to eye opening and closure. Present in the anterior head region is a 15-20 Hz beta activity. Photic stimulation was performed, did not show any abnormalities. Hyperventilation was also performed, did not show any abnormalities. Drowsiness was manifested by background fragmentation. There were presence of left temporal sharp and slow wave discharges. There was no focal slowing. There were no electrographic seizure identified.   Abnormality: Left temporal sharp and slow wave discharges   Impression: This is an abnormal awake and drowsy EEG due to presence of left temporal epileptiform discharges. This is consistent with an area of increase epileptogenic potential in the left temporal region.    Windell Norfolk, MD [facility]

## 2023-11-09 NOTE — Progress Notes (Signed)
 I will still try to switch her to either Keppra or Lamotrigine.

## 2023-11-10 ENCOUNTER — Telehealth: Payer: Self-pay | Admitting: Neurology

## 2023-11-10 NOTE — Telephone Encounter (Signed)
 Sent my chart message:   Tina Peck,    I reviewed your EEG with Dr. Teresa Coombs (our seizure doctor). Agrees to switch to new seizure medication to simply your regimen, less long term side effects.  Your EEG was abnormal showing left temporal epileptiform discharges.  Would recommend continue on seizure medication.  Would recommend we switch to Lamictal.  It has an extended release option meaning you would only have to take once daily.  If you are agreeable lets schedule a time to either have you come in or do a virtual visit to discuss the medication switch plan. Would be starting Lamictal XR 25 mg daily for 1 week, then 50 mg daily for 1 week, 100 mg daily 1 week, 150 mg daily 1 week then 200 mg daily. At that point reduce carbamazepine to 200 mg twice daily, then reduce by 200 mg monthly until off. Ultimately have your Lamictal XR 200 mg daily. Let me know your thoughts so we can get you scheduled. Thanks, Kadeem Hyle    Impression: This is an abnormal awake and drowsy EEG due to presence of left temporal epileptiform discharges. This is consistent with an area of increase epileptogenic potential in the left temporal region.

## 2023-11-10 NOTE — Telephone Encounter (Signed)
 Called pt regarding EEG results. Pt stated she is ok with starting the new medication. Appointment has been scheduled for 01/20/2024.

## 2023-11-11 NOTE — Telephone Encounter (Signed)
 Call to patient, in agreement for VV. Scheduled for 11/20/23

## 2023-11-12 DIAGNOSIS — H26492 Other secondary cataract, left eye: Secondary | ICD-10-CM | POA: Diagnosis not present

## 2023-11-12 DIAGNOSIS — Z961 Presence of intraocular lens: Secondary | ICD-10-CM | POA: Diagnosis not present

## 2023-11-19 DIAGNOSIS — H26491 Other secondary cataract, right eye: Secondary | ICD-10-CM | POA: Diagnosis not present

## 2023-11-19 NOTE — Progress Notes (Unsigned)
 Virtual Visit via Video Note  I connected with Tina Peck on 11/20/23 at 11:30 AM EDT by a video enabled telemedicine application and verified that I am speaking with the correct person using two identifiers.  Location: Patient: at her home Provider: in the office    I discussed the limitations of evaluation and management by telemedicine and the availability of in person appointments. The patient expressed understanding and agreed to proceed.  History of Present Illness: Update 11/20/23 SS: EEG February 2025 showed left temporal epileptiform discharges.  We are planning to switch off of carbamazepine to Lamictal XR.   Update 10/15/23 SS: Did not have labs sent at last visit, vitamin D 53, was taking supplement. Remains on carbamazepine 200 mg 3 times a day. No seizures know. Reports bone scan, was told osteoporosis in her spine.   05/28/22 SS: Tina Peck is here today for follow-up. Doing well, remains on carbamazepine 200 mg 3 times daily. No issues. Only 1 seizure in 2007. Has been told in the past seizure activity was seen on EEG. No health issues. Drives a car. Labs last year showed carbamazepine 4.4, alk phos 143, glucose 102. She takes of her grand kids during the day. Has not been to PCP in a long time.   05/20/21 MM: Ms. Deist is a 68 year female with a history of seizures.  She returns today for follow-up.  She had a single nocturnal generalized seizure in 2007.  She is currently on carbamazepine 200 mg 3 times a day.  She denies any recent seizure events.  She lives at home and is able to complete all ADLs independently.  She operates a Librarian, academic without difficulty.  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  She returns today for an evaluation.   Observations/Objective: Alert and oriented, speech is clear and concise, facial symmetry noted, moves all extremities freely  Assessment and Plan: Seizures (abnormal EEG showing left temporal epileptiform discharges,  switching from carbamazepine to Lamictal to minimize long-term side effects in the setting of osteoporosis, simplify medication regimen, Single nocturnal generalized seizure in 2007, EEG in the past showing left frontotemporal epileptiform activity, thus has been maintained on seizure medication)  -Start Lamictal XR 25 mg daily for 1 week, then 50 mg daily for 1 week, then 100 mg daily for 1 week, then 150  mg daily for 1 week, then 200 mg daily.  Once at Lamictal XR 200 mg daily reduce carbamazepine to 200 mg twice daily, then reduce carbamazepine by 200 mg monthly until off.  Ultimately will be on single agent Lamictal XR 200 mg daily. -Follow up in 1 month to recheck Lamictal level, sent myself a reminder to come April 10  Meds ordered this encounter  Medications   LamoTRIgine (LAMICTAL XR) 25 MG TB24 24 hour tablet    Sig: Take 1 tablet daily x 1 week., then take 2 tablets daily x 1 week, then take 4 tablets daily for 1 week (call for new script!)    Dispense:  50 tablet    Refill:  0    Follow Up Instructions: 6 months virtual or in office with Dr. Teresa Coombs to establish care, Dr. Teresa Coombs will be primary neurology, was former Dr. Sandria Manly patient.   I discussed the assessment and treatment plan with the patient. The patient was provided an opportunity to ask questions and all were answered. The patient agreed with the plan and demonstrated an understanding of the instructions.   The patient  was advised to call back or seek an in-person evaluation if the symptoms worsen or if the condition fails to improve as anticipated.  Otila Kluver, DNP  [facility] [address], [address] [place], [place] [address] [phone]

## 2023-11-20 ENCOUNTER — Telehealth: Admitting: Neurology

## 2023-11-20 DIAGNOSIS — G40309 Generalized idiopathic epilepsy and epileptic syndromes, not intractable, without status epilepticus: Secondary | ICD-10-CM

## 2023-11-20 MED ORDER — LAMOTRIGINE ER 25 MG PO TB24: ORAL_TABLET | ORAL | 0 refills | Status: DC

## 2023-11-20 NOTE — Patient Instructions (Signed)
-  For now continue carbamazepine at current dosing and follow directions below to cross titrate.  Drink plenty of water, watch for rash with Lamictal. -Start Lamictal XR 25 mg daily for 1 week, then 50 mg daily for 1 week, then 100 mg daily for 1 week, then 150  mg daily for 1 week, then 200 mg daily.  Once at Lamictal XR 200 mg daily reduce carbamazepine to 200 mg twice daily, then reduce carbamazepine by 200 mg monthly until off.  Ultimately will be on single agent Lamictal XR 200 mg daily. -Follow up in 1 month to recheck Lamictal level, sent myself a reminder to come April 10   Meds ordered this encounter  Medications   LamoTRIgine (LAMICTAL XR) 25 MG TB24 24 hour tablet    Sig: Take 1 tablet daily x 1 week., then take 2 tablets daily x 1 week, then take 4 tablets daily for 1 week (call for new script!)    Dispense:  50 tablet    Refill:  0

## 2023-11-26 DIAGNOSIS — H26491 Other secondary cataract, right eye: Secondary | ICD-10-CM | POA: Diagnosis not present

## 2023-11-26 DIAGNOSIS — M25562 Pain in left knee: Secondary | ICD-10-CM | POA: Diagnosis not present

## 2023-11-26 DIAGNOSIS — M1712 Unilateral primary osteoarthritis, left knee: Secondary | ICD-10-CM | POA: Diagnosis not present

## 2023-11-26 DIAGNOSIS — Z961 Presence of intraocular lens: Secondary | ICD-10-CM | POA: Diagnosis not present

## 2023-12-17 ENCOUNTER — Telehealth: Payer: Self-pay | Admitting: Neurology

## 2023-12-17 DIAGNOSIS — G40309 Generalized idiopathic epilepsy and epileptic syndromes, not intractable, without status epilepticus: Secondary | ICD-10-CM

## 2023-12-17 NOTE — Telephone Encounter (Signed)
 Please call and ask her to come for labs on Lamictal. Verify which step she is at on the titration off carbamazepine to Lamictal. Thanks  Orders Placed This Encounter  Procedures   Lamotrigine level   CBC with Differential/Platelet   CMP

## 2023-12-17 NOTE — Telephone Encounter (Signed)
 noted

## 2023-12-17 NOTE — Telephone Encounter (Signed)
 Call to patient, she reports she has not started the taper yet. She had a big family celebration and then came down with stomach bug. I advised to not start taper until recovered from stomach bug. She states she will start on 4/21 due to having an upcoming out of town wedding to attend. Advised we would like labs 3-4 weeks after taper. She is in agreement to come for labs at that time.

## 2024-01-20 ENCOUNTER — Ambulatory Visit: Admitting: Neurology

## 2024-01-27 ENCOUNTER — Encounter: Payer: Self-pay | Admitting: Neurology

## 2024-04-01 ENCOUNTER — Other Ambulatory Visit: Payer: Self-pay | Admitting: Family Medicine

## 2024-04-01 DIAGNOSIS — Z1231 Encounter for screening mammogram for malignant neoplasm of breast: Secondary | ICD-10-CM

## 2024-04-15 DIAGNOSIS — M810 Age-related osteoporosis without current pathological fracture: Secondary | ICD-10-CM | POA: Diagnosis not present

## 2024-04-15 DIAGNOSIS — R490 Dysphonia: Secondary | ICD-10-CM | POA: Diagnosis not present

## 2024-04-15 DIAGNOSIS — Z7189 Other specified counseling: Secondary | ICD-10-CM | POA: Diagnosis not present

## 2024-04-15 DIAGNOSIS — Z6823 Body mass index (BMI) 23.0-23.9, adult: Secondary | ICD-10-CM | POA: Diagnosis not present

## 2024-04-18 ENCOUNTER — Other Ambulatory Visit: Payer: Self-pay | Admitting: Family Medicine

## 2024-04-18 DIAGNOSIS — Z13820 Encounter for screening for osteoporosis: Secondary | ICD-10-CM

## 2024-04-29 ENCOUNTER — Ambulatory Visit

## 2024-05-12 ENCOUNTER — Ambulatory Visit
Admission: RE | Admit: 2024-05-12 | Discharge: 2024-05-12 | Disposition: A | Discharge status: Home / Self Care | Source: Ambulatory Visit | Attending: Family Medicine | Admitting: Family Medicine

## 2024-05-12 DIAGNOSIS — Z1231 Encounter for screening mammogram for malignant neoplasm of breast: Secondary | ICD-10-CM

## 2024-05-16 DIAGNOSIS — K219 Gastro-esophageal reflux disease without esophagitis: Secondary | ICD-10-CM | POA: Diagnosis not present

## 2024-05-16 DIAGNOSIS — R490 Dysphonia: Secondary | ICD-10-CM | POA: Diagnosis not present

## 2024-06-16 ENCOUNTER — Ambulatory Visit: Admitting: Neurology

## 2024-09-05 ENCOUNTER — Other Ambulatory Visit: Payer: Self-pay

## 2024-09-05 ENCOUNTER — Encounter: Payer: Self-pay | Admitting: Neurology

## 2024-09-05 ENCOUNTER — Ambulatory Visit: Admitting: Neurology

## 2024-09-05 VITALS — BP 128/78 | HR 86 | Ht 69.0 in | Wt 161.5 lb

## 2024-09-05 DIAGNOSIS — G40909 Epilepsy, unspecified, not intractable, without status epilepticus: Secondary | ICD-10-CM

## 2024-09-05 DIAGNOSIS — G40309 Generalized idiopathic epilepsy and epileptic syndromes, not intractable, without status epilepticus: Secondary | ICD-10-CM

## 2024-09-05 MED ORDER — VITAMIN D (ERGOCALCIFEROL) 1.25 MG (50000 UNIT) PO CAPS: 50000.0000 [IU] | ORAL_CAPSULE | ORAL | 0 refills | Status: AC

## 2024-09-05 NOTE — Progress Notes (Signed)
 "  [facility]  PATIENT: Tina Peck DOB: 1956-03-02  REQUESTING CLINICIAN: Nanci Senior, MD HISTORY FROM: Patient and chart review  REASON FOR VISIT: Focal epilepsy   HISTORICAL  CHIEF COMPLAINT:  No chief complaint on file.   HISTORY OF PRESENT ILLNESS:  Discussed the use of AI scribe software for clinical note transcription with the patient, who gave verbal consent to proceed.  Tina Peck is a 68 year old female with a history of seizures who presents for a neurology follow-up.  She experienced a single nocturnal seizure in 2007 at 11 PM and has been on carbamazepine  (Tegretol ) since then, with no further seizures. Although advised to switch to lamotrigine , she found the transition too complicated due to her busy lifestyle, which includes caring for her young grandsons.  Her recent EEG showed epileptiform discharges on the left temporal region. She is not currently taking vitamin D  or calcium supplements but is considering them due to a strong family history of osteoporosis. She walks three miles daily to help maintain bone health.  She experiences easy bruising, which she attributes to aspirin use. No history of tremors and no issues with the current medication regimen. No recent seizures since 2007.  She has a family history of osteoporosis, particularly on her mother's side, and her mother is 4 years old. She is changing her insurance starting January 1st.    Handedness: Right-handed  Onset: 2007, single seizure  Seizure Type: Nocturnal seizure  Current frequency: Only once  Any injuries from seizures: Denies  Seizure risk factors: None reported  Previous ASMs: Carbamazepine   Currenty ASMs: Carbamazepine  200 mg 3 times daily  ASMs side effects: Denies  Brain Images: Negative head CT in 2007  Previous EEGs: Left temporal epileptiform discharge   OTHER MEDICAL CONDITIONS: Seizure disorder  REVIEW OF SYSTEMS: Full 14 system  review of systems performed and negative with exception of: As noted in the HPI  ALLERGIES: Allergies[1]  HOME MEDICATIONS: Outpatient Medications Prior to Visit  Medication Sig Dispense Refill   aspirin 81 MG chewable tablet Chew 81 mg by mouth daily.     carbamazepine  (TEGRETOL ) 200 MG tablet Take 1 tablet (200 mg total) by mouth 3 (three) times daily. 270 tablet 4   polyethylene glycol (MIRALAX / GLYCOLAX) packet Take 17 g by mouth daily. 1/2 cap po daily     COLLAGEN PO Take by mouth daily. Powder mixes with milk  take daily 10.5 gram in scoop (Patient not taking: Reported on 09/05/2024)     LamoTRIgine  (LAMICTAL  XR) 25 MG TB24 24 hour tablet Take 1 tablet daily x 1 week., then take 2 tablets daily x 1 week, then take 4 tablets daily for 1 week (call for new script!) (Patient not taking: Reported on 09/05/2024) 50 tablet 0   sulfamethoxazole -trimethoprim  (BACTRIM  DS) 800-160 MG tablet Take 1 tablet by mouth 2 (two) times daily. One PO BID x 3 days (Patient not taking: Reported on 09/05/2024) 6 tablet 0   Vitamin D , Ergocalciferol , (DRISDOL ) 1.25 MG (50000 UNIT) CAPS capsule Take 1 capsule (50,000 Units total) by mouth every 7 (seven) days. (Patient not taking: Reported on 09/05/2024) 6 capsule 0   No facility-administered medications prior to visit.    PAST MEDICAL HISTORY: Past Medical History:  Diagnosis Date   Seizure (HCC) 2007   frontal lobe, only one seizure in 2007, none since   SVD (spontaneous vaginal delivery)    x 6    PAST SURGICAL HISTORY: Past Surgical History:  Procedure  Laterality Date   ABDOMINAL HYSTERECTOMY N/A 03/04/2016   Procedure: HYSTERECTOMY ABDOMINAL;  Surgeon: Bobie FORBES Cathlyn JAYSON Nikki, MD;  Location: [facility];  Service: Gynecology;  Laterality: N/A;  ANESTHESIA CONSULT NEEDED   ABDOMINAL SACROCOLPOPEXY N/A 03/04/2016   Procedure: ABDOMINO SACROCOLPOPEXY with HALBAN'S CULDOPLASTY;  Surgeon: Bobie FORBES Cathlyn JAYSON Nikki, MD;  Location: [facility];  Service:  Gynecology;  Laterality: N/A;   ANTERIOR AND POSTERIOR REPAIR N/A 03/04/2016   Procedure: ANTERIOR (CYSTOCELE) AND POSTERIOR REPAIR (RECTOCELE);  Surgeon: Bobie FORBES Cathlyn JAYSON Nikki, MD;  Location: [facility];  Service: Gynecology;  Laterality: N/A;   BILATERAL SALPINGECTOMY  03/04/2016   Procedure: BILATERAL SALPINGECTOMY;  Surgeon: Bobie FORBES Cathlyn JAYSON Nikki, MD;  Location: [facility];  Service: Gynecology;;   BLADDER SUSPENSION N/A 03/04/2016   Procedure: TRANSVAGINAL TAPE (TVT) PROCEDURE exact midurethral sling;  Surgeon: Bobie FORBES Cathlyn JAYSON Nikki, MD;  Location: [facility];  Service: Gynecology;  Laterality: N/A;   COLONOSCOPY     normal   CYSTO N/A 03/04/2016   Procedure: CYSTO;  Surgeon: Bobie FORBES Cathlyn JAYSON Nikki, MD;  Location: [facility];  Service: Gynecology;  Laterality: N/A;   DIAGNOSTIC LAPAROSCOPY Left 1986   LSO for dermoid cyst   OOPHORECTOMY Left 1986   LSO-Dermoid   OOPHORECTOMY  03/04/2016   Procedure: RIGHT OOPHORECTOMY;  Surgeon: Bobie FORBES Cathlyn JAYSON Nikki, MD;  Location: [facility];  Service: Gynecology;;   TONSILLECTOMY     as a child    FAMILY HISTORY: Family History  Problem Relation Age of Onset   Osteoarthritis Mother    Glaucoma Father 29   Seizures Father        developed very late in life    Spina bifida Brother    Cancer Maternal Grandfather    Diabetes Paternal Grandmother    Breast cancer Neg Hx     SOCIAL HISTORY: Social History   Socioeconomic History   Marital status: Married    Spouse name: Not on file   Number of children: 6   Years of education: 12th   Highest education level: Not on file  Occupational History   Occupation: homemaker  Tobacco Use   Smoking status: Never   Smokeless tobacco: Never  Vaping Use   Vaping status: Never Used  Substance and Sexual Activity   Alcohol use: No    Alcohol/week: 0.0 standard drinks of alcohol   Drug use: No   Sexual activity: Yes    Partners: Male    Birth control/protection: Post-menopausal, Surgical    Comment:  TAH/Bil.salpingectomy/RSO-1st intercourse 68 yo-Fewer than 5 partners  Other Topics Concern   Not on file  Social History Narrative   Patient lives at home with her husband and has 6 children. 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    Right handed   Caffeine: 32 oz coffee and about 24 oz hot tea daily   Social Drivers of Health   Tobacco Use: Low Risk (09/05/2024)   Patient History    Smoking Tobacco Use: Never    Smokeless Tobacco Use: Never    Passive Exposure: Not on file  Financial Resource Strain: Not on file  Food Insecurity: Not on file  Transportation Needs: Not on file  Physical Activity: Not on file  Stress: Not on file  Social Connections: Unknown (07/21/2022)   Received from [facility]   Social Network    Social Network: Not on file  Intimate Partner Violence: Unknown (07/21/2022)   Received from [facility]   HITS    Physically Hurt: Not on file    Insult or Talk Down To: Not on file    Threaten Physical Harm: Not on file    Scream or Curse: Not on file  Depression (PHQ2-9): Not on file  Alcohol Screen: Not on file  Housing: Not on file  Utilities: Not on file  Health Literacy: Not on file    PHYSICAL EXAM  GENERAL EXAM/CONSTITUTIONAL: Vitals:  Vitals:   09/05/24 1006  BP: 128/78  Pulse: 86  SpO2: 98%  Weight: 161 lb 8 oz (73.3 kg)  Height: 5' 9 (1.753 m)   Body mass index is 23.85 kg/m. Wt Readings from Last 3 Encounters:  09/05/24 161 lb 8 oz (73.3 kg)  02/12/23 167 lb (75.8 kg)  12/20/21 166 lb (75.3 kg)   Patient is in no distress; well developed, nourished and groomed; neck is supple  MUSCULOSKELETAL: Gait, strength, tone, movements noted in Neurologic exam below  NEUROLOGIC: MENTAL STATUS:      No data to display         awake, alert, oriented to person, place and time recent and remote memory intact normal attention and concentration language fluent, comprehension intact, naming intact fund of  knowledge appropriate  CRANIAL NERVE:  2nd, 3rd, 4th, 6th - Visual fields full to confrontation, extraocular muscles intact, no nystagmus 5th - facial sensation symmetric 7th - facial strength symmetric 8th - hearing intact 9th - palate elevates symmetrically, uvula midline 11th - shoulder shrug symmetric 12th - tongue protrusion midline  MOTOR:  normal bulk and tone, full strength in the BUE, BLE  GAIT/STATION:  normal     DIAGNOSTIC DATA (LABS, IMAGING, TESTING) - I reviewed patient records, labs, notes, testing and imaging myself where available.  Lab Results  Component Value Date   WBC 5.6 12/25/2022   HGB 14.2 12/25/2022   HCT 42.5 12/25/2022   MCV 88.0 12/25/2022   PLT 329 12/25/2022      Component Value Date/Time   NA 140 12/25/2022 0905   NA 140 05/20/2021 1049   K 4.8 12/25/2022 0905   CL 104 12/25/2022 0905   CO2 29 12/25/2022 0905   GLUCOSE 103 (H) 12/25/2022 0905   BUN 11 12/25/2022 0905   BUN 14 05/20/2021 1049   CREATININE 0.57 12/25/2022 0905   CALCIUM 9.5 12/25/2022 0905   PROT 6.9 05/20/2021 1049   ALBUMIN 4.5 05/20/2021 1049   AST 13 05/20/2021 1049   ALT 15 05/20/2021 1049   ALKPHOS 143 (H) 05/20/2021 1049   BILITOT <0.2 05/20/2021 1049   GFRNONAA 96 05/16/2020 0938   GFRAA 111 05/16/2020 0938   Lab Results  Component Value Date   CHOL 213 (H) 07/11/2020   HDL 56 07/11/2020   LDLCALC 136 (H) 07/11/2020   TRIG 120 07/11/2020   No results found for: HGBA1C No results found for: VITAMINB12 Lab Results  Component Value Date   TSH 1.11 12/25/2022   Routine EEG 11/06/2023 Left temporal sharp and slow wave discharges    ASSESSMENT AND PLAN  69 y.o. year old female  with    Focal Epilepsy Single nocturnal seizure in 2007. Recent EEG showed epileptiform discharges on the left temporal region. Currently on carbamazepine , which is well-tolerated and effective. Transition to lamotrigine  was considered but not initiated due to  lifestyle constraints and potential bone health concerns. No tremors or breakthrough seizures reported. - Continue carbamazepine  as current regimen. - Ensure adequate supply of carbamazepine  with refills. -  Ordered labs to check carbamazepine  level, vitamin D , and CMP.  Osteoporosis risk and bone health management Increased risk of osteoporosis due to family history and current carbamazepine  use. Recent bone density test performed. Not currently taking vitamin D  or calcium supplements. Discussed the importance of vitamin D  and calcium for bone health. She prefers weekly vitamin D  due to difficulty with large pills. - Prescribed weekly vitamin D  supplement starting January 5th. - Advised taking calcium supplement daily. - Discussed with pharmacist about smaller pill options for calcium and vitamin D .   1. Nonintractable epilepsy without status epilepticus, unspecified epilepsy type [facility])     Patient Instructions  Continue with carbamazepine  200 mg 3 times daily Continue with your other medications Start vitamin D  supplement, once a week Start calcium supplement Return in 1 year or sooner if worse   Per [place]  DMV statutes, patients with seizures are not allowed to drive until they have been seizure-free for six months.  Other recommendations include using caution when using heavy equipment or power tools. Avoid working on ladders or at heights. Take showers instead of baths.  Do not swim alone.  Ensure the water  temperature is not too high on the home water  heater. Do not go swimming alone. Do not lock yourself in a room alone (i.e. bathroom). When caring for infants or small children, sit down when holding, feeding, or changing them to minimize risk of injury to the child in the event you have a seizure. Maintain good sleep hygiene. Avoid alcohol.  Also recommend adequate sleep, hydration, good diet and minimize stress.   During the Seizure  - First, ensure adequate ventilation  and place patients on the floor on their left side  Loosen clothing around the neck and ensure the airway is patent. If the patient is clenching the teeth, do not force the mouth open with any object as this can cause severe damage - Remove all items from the surrounding that can be hazardous. The patient may be oblivious to what's happening and may not even know what he or she is doing. If the patient is confused and wandering, either gently guide him/her away and block access to outside areas - Reassure the individual and be comforting - Call 911. In most cases, the seizure ends before EMS arrives. However, there are cases when seizures may last over 3 to 5 minutes. Or the individual may have developed breathing difficulties or severe injuries. If a pregnant patient or a person with diabetes develops a seizure, it is prudent to call an ambulance. - Finally, if the patient does not regain full consciousness, then call EMS. Most patients will remain confused for about 45 to 90 minutes after a seizure, so you must use judgment in calling for help. - Avoid restraints but make sure the patient is in a bed with padded side rails - Place the individual in a lateral position with the neck slightly flexed; this will help the saliva drain from the mouth and prevent the tongue from falling backward - Remove all nearby furniture and other hazards from the area - Provide verbal assurance as the individual is regaining consciousness - Provide the patient with privacy if possible - Call for help and start treatment as ordered by the caregiver   After the Seizure (Postictal Stage)  After a seizure, most patients experience confusion, fatigue, muscle pain and/or a headache. Thus, one should permit the individual to sleep. For the next few days, reassurance is essential. Being calm and helping  reorient the person is also of importance.  Most seizures are painless and end spontaneously. Seizures are not harmful to  others but can lead to complications such as stress on the lungs, brain and the heart. Individuals with prior lung problems may develop labored breathing and respiratory distress.    Discussed Patients with epilepsy have a small risk of sudden unexpected death, a condition referred to as sudden unexpected death in epilepsy (SUDEP). SUDEP is defined specifically as the sudden, unexpected, witnessed or unwitnessed, nontraumatic and nondrowning death in patients with epilepsy with or without evidence for a seizure, and excluding documented status epilepticus, in which post mortem examination does not reveal a structural or toxicologic cause for death     No orders of the defined types were placed in this encounter.   Meds ordered this encounter  Medications   Vitamin D , Ergocalciferol , (DRISDOL ) 1.25 MG (50000 UNIT) CAPS capsule    Sig: Take 1 capsule (50,000 Units total) by mouth every 7 (seven) days.    Dispense:  12 capsule    Refill:  0    Please dispense on or after Jan 5    Return in about 1 year (around 09/05/2025).    Pastor Falling, MD 09/05/2024, 11:33 AM  [facility] [address], [address] [place], [place] [address] [phone]       [1] No Known Allergies  "

## 2024-09-05 NOTE — Patient Instructions (Addendum)
 Continue with carbamazepine  200 mg 3 times daily Continue with your other medications Start vitamin D  supplement, once a week Start calcium supplement Return in 1 year or sooner if worse

## 2024-09-06 ENCOUNTER — Ambulatory Visit: Payer: Self-pay | Admitting: Neurology

## 2024-09-06 LAB — CARBAMAZEPINE LEVEL, TOTAL: Carbamazepine (Tegretol), S: 5.1 ug/mL (ref 4.0–12.0)

## 2024-09-06 LAB — VITAMIN D 25 HYDROXY (VIT D DEFICIENCY, FRACTURES): Vit D, 25-Hydroxy: 21.7 ng/mL — ABNORMAL LOW (ref 30.0–100.0)

## 2025-09-06 ENCOUNTER — Ambulatory Visit: Admitting: Neurology
